# Patient Record
Sex: Male | Born: 1965 | Race: White | Hispanic: No | Marital: Married | Smoking: Never smoker
Health system: Southern US, Community
[De-identification: ages and names within clinical notes are randomized; demographics above are authoritative.]

## PROBLEM LIST (undated history)

## (undated) DIAGNOSIS — I10 Essential (primary) hypertension: Secondary | ICD-10-CM

## (undated) DIAGNOSIS — N289 Disorder of kidney and ureter, unspecified: Secondary | ICD-10-CM

---

## 2020-11-22 ENCOUNTER — Other Ambulatory Visit: Payer: Self-pay

## 2020-11-22 ENCOUNTER — Encounter (HOSPITAL_COMMUNITY): Payer: Self-pay

## 2020-11-22 ENCOUNTER — Encounter (HOSPITAL_COMMUNITY)
Admission: EM | Disposition: A | Discharge status: Home / Self Care | Payer: Self-pay | Source: Home / Self Care | Attending: Internal Medicine

## 2020-11-22 ENCOUNTER — Inpatient Hospital Stay (HOSPITAL_COMMUNITY): Payer: 59 | Admitting: Certified Registered Nurse Anesthetist

## 2020-11-22 ENCOUNTER — Inpatient Hospital Stay (HOSPITAL_COMMUNITY)
Admission: EM | Admit: 2020-11-22 | Discharge: 2020-11-24 | DRG: 378 | Disposition: A | Discharge status: Home / Self Care | Payer: 59 | Attending: Internal Medicine | Admitting: Internal Medicine

## 2020-11-22 ENCOUNTER — Emergency Department (HOSPITAL_COMMUNITY): Payer: 59

## 2020-11-22 DIAGNOSIS — D509 Iron deficiency anemia, unspecified: Secondary | ICD-10-CM

## 2020-11-22 DIAGNOSIS — N1831 Chronic kidney disease, stage 3a: Secondary | ICD-10-CM | POA: Diagnosis present

## 2020-11-22 DIAGNOSIS — E869 Volume depletion, unspecified: Secondary | ICD-10-CM | POA: Diagnosis present

## 2020-11-22 DIAGNOSIS — R1110 Vomiting, unspecified: Secondary | ICD-10-CM | POA: Diagnosis present

## 2020-11-22 DIAGNOSIS — K284 Chronic or unspecified gastrojejunal ulcer with hemorrhage: Secondary | ICD-10-CM | POA: Diagnosis not present

## 2020-11-22 DIAGNOSIS — D649 Anemia, unspecified: Secondary | ICD-10-CM

## 2020-11-22 DIAGNOSIS — Z7982 Long term (current) use of aspirin: Secondary | ICD-10-CM

## 2020-11-22 DIAGNOSIS — N179 Acute kidney failure, unspecified: Secondary | ICD-10-CM

## 2020-11-22 DIAGNOSIS — D500 Iron deficiency anemia secondary to blood loss (chronic): Secondary | ICD-10-CM | POA: Diagnosis not present

## 2020-11-22 DIAGNOSIS — D62 Acute posthemorrhagic anemia: Secondary | ICD-10-CM | POA: Diagnosis present

## 2020-11-22 DIAGNOSIS — D75839 Thrombocytosis, unspecified: Secondary | ICD-10-CM

## 2020-11-22 DIAGNOSIS — Z8616 Personal history of COVID-19: Secondary | ICD-10-CM

## 2020-11-22 DIAGNOSIS — N189 Chronic kidney disease, unspecified: Secondary | ICD-10-CM | POA: Diagnosis present

## 2020-11-22 DIAGNOSIS — D473 Essential (hemorrhagic) thrombocythemia: Secondary | ICD-10-CM | POA: Diagnosis present

## 2020-11-22 DIAGNOSIS — I1310 Hypertensive heart and chronic kidney disease without heart failure, with stage 1 through stage 4 chronic kidney disease, or unspecified chronic kidney disease: Secondary | ICD-10-CM | POA: Diagnosis present

## 2020-11-22 DIAGNOSIS — I700 Atherosclerosis of aorta: Secondary | ICD-10-CM | POA: Diagnosis present

## 2020-11-22 DIAGNOSIS — Z79899 Other long term (current) drug therapy: Secondary | ICD-10-CM | POA: Diagnosis not present

## 2020-11-22 DIAGNOSIS — K264 Chronic or unspecified duodenal ulcer with hemorrhage: Principal | ICD-10-CM | POA: Diagnosis present

## 2020-11-22 DIAGNOSIS — K922 Gastrointestinal hemorrhage, unspecified: Secondary | ICD-10-CM | POA: Diagnosis present

## 2020-11-22 DIAGNOSIS — D75838 Other thrombocytosis: Secondary | ICD-10-CM | POA: Diagnosis present

## 2020-11-22 DIAGNOSIS — K219 Gastro-esophageal reflux disease without esophagitis: Secondary | ICD-10-CM | POA: Diagnosis present

## 2020-11-22 HISTORY — DX: Disorder of kidney and ureter, unspecified: N28.9

## 2020-11-22 HISTORY — PX: BIOPSY: SHX5522

## 2020-11-22 HISTORY — DX: Essential (primary) hypertension: I10

## 2020-11-22 HISTORY — PX: ESOPHAGOGASTRODUODENOSCOPY (EGD) WITH PROPOFOL: SHX5813

## 2020-11-22 LAB — COMPREHENSIVE METABOLIC PANEL
ALT: 11 U/L (ref 0–44)
AST: 16 U/L (ref 15–41)
Albumin: 3.5 g/dL (ref 3.5–5.0)
Alkaline Phosphatase: 31 U/L — ABNORMAL LOW (ref 38–126)
Anion gap: 9 (ref 5–15)
BUN: 60 mg/dL — ABNORMAL HIGH (ref 6–20)
CO2: 21 mmol/L — ABNORMAL LOW (ref 22–32)
Calcium: 8.7 mg/dL — ABNORMAL LOW (ref 8.9–10.3)
Chloride: 107 mmol/L (ref 98–111)
Creatinine, Ser: 1.71 mg/dL — ABNORMAL HIGH (ref 0.61–1.24)
GFR, Estimated: 47 mL/min — ABNORMAL LOW (ref >60)
Glucose, Bld: 171 mg/dL — ABNORMAL HIGH (ref 70–99)
Potassium: 3.9 mmol/L (ref 3.5–5.1)
Sodium: 137 mmol/L (ref 135–145)
Total Bilirubin: 0.4 mg/dL (ref 0.3–1.2)
Total Protein: 6.1 g/dL — ABNORMAL LOW (ref 6.5–8.1)

## 2020-11-22 LAB — URINALYSIS, ROUTINE W REFLEX MICROSCOPIC
Bacteria, UA: NONE SEEN
Bilirubin Urine: NEGATIVE
Glucose, UA: NEGATIVE mg/dL
Hgb urine dipstick: NEGATIVE
Ketones, ur: NEGATIVE mg/dL
Leukocytes,Ua: NEGATIVE
Nitrite: NEGATIVE
Protein, ur: 30 mg/dL — AB
Specific Gravity, Urine: 1.026 (ref 1.005–1.030)
pH: 5 (ref 5.0–8.0)

## 2020-11-22 LAB — CBC
HCT: 27.6 % — ABNORMAL LOW (ref 39.0–52.0)
Hemoglobin: 9.3 g/dL — ABNORMAL LOW (ref 13.0–17.0)
MCH: 31.7 pg (ref 26.0–34.0)
MCHC: 33.7 g/dL (ref 30.0–36.0)
MCV: 94.2 fL (ref 80.0–100.0)
Platelets: 2357 10*3/uL (ref 150–400)
RBC: 2.93 MIL/uL — ABNORMAL LOW (ref 4.22–5.81)
RDW: 15.4 % (ref 11.5–15.5)
WBC: 18.4 10*3/uL — ABNORMAL HIGH (ref 4.0–10.5)
nRBC: 0 % (ref 0.0–0.2)

## 2020-11-22 LAB — CBC WITH DIFFERENTIAL/PLATELET
Abs Immature Granulocytes: 0.18 10*3/uL — ABNORMAL HIGH (ref 0.00–0.07)
Basophils Absolute: 0.1 10*3/uL (ref 0.0–0.1)
Basophils Relative: 1 %
Eosinophils Absolute: 0 10*3/uL (ref 0.0–0.5)
Eosinophils Relative: 0 %
HCT: 25.8 % — ABNORMAL LOW (ref 39.0–52.0)
Hemoglobin: 8.7 g/dL — ABNORMAL LOW (ref 13.0–17.0)
Immature Granulocytes: 1 %
Lymphocytes Relative: 6 %
Lymphs Abs: 0.9 10*3/uL (ref 0.7–4.0)
MCH: 32.1 pg (ref 26.0–34.0)
MCHC: 33.7 g/dL (ref 30.0–36.0)
MCV: 95.2 fL (ref 80.0–100.0)
Monocytes Absolute: 1 10*3/uL (ref 0.1–1.0)
Monocytes Relative: 6 %
Neutro Abs: 14 10*3/uL — ABNORMAL HIGH (ref 1.7–7.7)
Neutrophils Relative %: 86 %
Platelets: 1788 10*3/uL (ref 150–400)
RBC: 2.71 MIL/uL — ABNORMAL LOW (ref 4.22–5.81)
RDW: 15.4 % (ref 11.5–15.5)
WBC: 16.2 10*3/uL — ABNORMAL HIGH (ref 4.0–10.5)
nRBC: 0 % (ref 0.0–0.2)

## 2020-11-22 LAB — RESP PANEL BY RT-PCR (FLU A&B, COVID) ARPGX2
Influenza A by PCR: NEGATIVE
Influenza B by PCR: NEGATIVE
SARS Coronavirus 2 by RT PCR: NEGATIVE

## 2020-11-22 LAB — TROPONIN I (HIGH SENSITIVITY)
Troponin I (High Sensitivity): 6 ng/L (ref <18)
Troponin I (High Sensitivity): 4 ng/L (ref <18)

## 2020-11-22 LAB — PROTIME-INR
INR: 1.3 — ABNORMAL HIGH (ref 0.8–1.2)
Prothrombin Time: 15.4 seconds — ABNORMAL HIGH (ref 11.4–15.2)

## 2020-11-22 LAB — PREPARE RBC (CROSSMATCH)

## 2020-11-22 LAB — POC OCCULT BLOOD, ED: Fecal Occult Bld: POSITIVE — AB

## 2020-11-22 LAB — LIPASE, BLOOD: Lipase: 39 U/L (ref 11–51)

## 2020-11-22 LAB — LACTIC ACID, PLASMA: Lactic Acid, Venous: 1.2 mmol/L (ref 0.5–1.9)

## 2020-11-22 LAB — ABO/RH: ABO/RH(D): O NEG

## 2020-11-22 LAB — HIV ANTIBODY (ROUTINE TESTING W REFLEX): HIV Screen 4th Generation wRfx: NONREACTIVE

## 2020-11-22 SURGERY — ESOPHAGOGASTRODUODENOSCOPY (EGD) WITH PROPOFOL
Anesthesia: Monitor Anesthesia Care

## 2020-11-22 MED ORDER — PANTOPRAZOLE SODIUM 40 MG PO TBEC
80.0000 mg | DELAYED_RELEASE_TABLET | Freq: Two times a day (BID) | ORAL | Status: DC
  Administered 2020-11-22 – 2020-11-24 (×4): 80 mg via ORAL
  Filled 2020-11-22 (×4): qty 2

## 2020-11-22 MED ORDER — IOHEXOL 300 MG/ML  SOLN
100.0000 mL | Freq: Once | INTRAMUSCULAR | Status: AC | PRN
  Administered 2020-11-22: 100 mL via INTRAVENOUS

## 2020-11-22 MED ORDER — SODIUM CHLORIDE 0.9 % IV BOLUS
1000.0000 mL | Freq: Once | INTRAVENOUS | Status: AC
  Administered 2020-11-22: 1000 mL via INTRAVENOUS

## 2020-11-22 MED ORDER — ACETAMINOPHEN 325 MG PO TABS: 650.0000 mg | ORAL_TABLET | Freq: Four times a day (QID) | ORAL | Status: DC | PRN

## 2020-11-22 MED ORDER — LIDOCAINE 2% (20 MG/ML) 5 ML SYRINGE
INTRAMUSCULAR | Status: DC | PRN
  Administered 2020-11-22: 100 mg via INTRAVENOUS

## 2020-11-22 MED ORDER — SODIUM CHLORIDE 0.9 % IV SOLN
80.0000 mg | Freq: Once | INTRAVENOUS | Status: AC
  Administered 2020-11-22: 80 mg via INTRAVENOUS
  Filled 2020-11-22: qty 80

## 2020-11-22 MED ORDER — ACETAMINOPHEN 650 MG RE SUPP: 650.0000 mg | Freq: Four times a day (QID) | RECTAL | Status: DC | PRN

## 2020-11-22 MED ORDER — LACTATED RINGERS IV SOLN: INTRAVENOUS | Status: DC | PRN

## 2020-11-22 MED ORDER — PROPOFOL 500 MG/50ML IV EMUL
INTRAVENOUS | Status: DC | PRN
  Administered 2020-11-22: 125 ug/kg/min via INTRAVENOUS

## 2020-11-22 MED ORDER — SODIUM CHLORIDE 0.9 % IV SOLN: 10.0000 mL/h | Freq: Once | INTRAVENOUS | Status: DC

## 2020-11-22 MED ORDER — PROPOFOL 10 MG/ML IV BOLUS
INTRAVENOUS | Status: DC | PRN
  Administered 2020-11-22 (×3): 30 mg via INTRAVENOUS
  Administered 2020-11-22 (×2): 20 mg via INTRAVENOUS

## 2020-11-22 SURGICAL SUPPLY — 14 items

## 2020-11-22 NOTE — ED Triage Notes (Signed)
Patient complains of abdominal pain with diarrhea x 3 days. Patient pale on arrival and states that he feels near syncopal with any activity. Alert and oriented

## 2020-11-22 NOTE — ED Provider Notes (Signed)
[facility] EMERGENCY DEPARTMENT Provider Note   CSN: [id number] Arrival date & time: 11/22/20  5188     History No chief complaint on file.   Jermaine Russell is a 55 y.o. male.  55 year old male with history of hypertension and renal disorder, onset Saturday (3 days ago)- diarrhea, nausea, no vomiting, upper abdominal pain that radiates to chest, SHOB with walking and improves with rest. No known sick contacts. Diarrhea is described as black, taking peptobismal with some relief. No prior abdominal surgeries. Denies fevers, does report chills. Decreased PO intake due to diarrhea and pain.  History cardiomegaly, scheduled to see cardiology this month, recently diagnosed with HTN. Not taking meds due to being sick. Takes 81mg  ASA every other day for elevated platelets. History of COVID x 3,         Past Medical History:  Diagnosis Date  . Hypertension   . Renal disorder     There are no problems to display for this patient.   History reviewed. No pertinent surgical history.     No family history on file.     Home Medications Prior to Admission medications   Not on File    Allergies    Patient has no known allergies.  Review of Systems   Review of Systems  Constitutional: Positive for chills. Negative for fever.  Respiratory: Positive for shortness of breath.   Cardiovascular: Negative for chest pain.  Gastrointestinal: Positive for abdominal pain, diarrhea and nausea. Negative for constipation and vomiting.  Genitourinary: Negative for decreased urine volume, difficulty urinating and dysuria.  Musculoskeletal: Negative for arthralgias and myalgias.  Skin: Negative for rash and wound.  Allergic/Immunologic: Negative for immunocompromised state.  Neurological: Positive for light-headedness.  Psychiatric/Behavioral: Negative for confusion.  All other systems reviewed and are negative.   Physical Exam Updated Vital Signs BP (!) 148/84    Pulse (!) 103   Temp 98.3 F (36.8 C) (Oral)   Resp 16   SpO2 100%   Physical Exam Vitals and nursing note reviewed.  Constitutional:      General: He is not in acute distress.    Appearance: He is well-developed. He is not diaphoretic.  HENT:     Head: Normocephalic and atraumatic.     Ears:     Comments: Mucous membranes pale    Mouth/Throat:     Mouth: Mucous membranes are moist.  Eyes:     Comments: Conjunctivae pale   Cardiovascular:     Rate and Rhythm: Regular rhythm. Tachycardia present.     Pulses: Normal pulses.     Heart sounds: Normal heart sounds.  Pulmonary:     Effort: Pulmonary effort is normal.     Breath sounds: Normal breath sounds.  Chest:     Chest wall: No tenderness.  Abdominal:     Palpations: Abdomen is soft.     Tenderness: There is no abdominal tenderness.  Genitourinary:    Rectum: Guaiac result positive.     Comments: Rectal exam deferred, stool sample provided- black, formed, hemoccult positive Musculoskeletal:        General: No swelling or tenderness.     Right lower leg: No edema.     Left lower leg: No edema.  Skin:    General: Skin is warm and dry.     Coloration: Skin is pale.  Neurological:     Mental Status: He is alert and oriented to person, place, and time.     Motor:  No weakness.     Gait: Gait normal.  Psychiatric:        Behavior: Behavior normal.     ED Results / Procedures / Treatments   Labs (all labs ordered are listed, but only abnormal results are displayed) Labs Reviewed  COMPREHENSIVE METABOLIC PANEL - Abnormal; Notable for the following components:      Result Value   CO2 21 (*)    Glucose, Bld 171 (*)    BUN 60 (*)    Creatinine, Ser 1.71 (*)    Calcium 8.7 (*)    Total Protein 6.1 (*)    Alkaline Phosphatase 31 (*)    GFR, Estimated 47 (*)    All other components within normal limits  CBC - Abnormal; Notable for the following components:   WBC 18.4 (*)    RBC 2.93 (*)    Hemoglobin 9.3 (*)     HCT 27.6 (*)    Platelets 2,357 (*)    All other components within normal limits  URINALYSIS, ROUTINE W REFLEX MICROSCOPIC - Abnormal; Notable for the following components:   APPearance HAZY (*)    Protein, ur 30 (*)    All other components within normal limits  POC OCCULT BLOOD, ED - Abnormal; Notable for the following components:   Fecal Occult Bld POSITIVE (*)    All other components within normal limits  RESP PANEL BY RT-PCR (FLU A&B, COVID) ARPGX2  LIPASE, BLOOD  PROTIME-INR  PATHOLOGIST SMEAR REVIEW  TYPE AND SCREEN  PREPARE RBC (CROSSMATCH)  ABO/RH  TROPONIN I (HIGH SENSITIVITY)    EKG EKG Interpretation  Date/Time:  Tuesday November 22 2020 09:31:31 EDT Ventricular Rate:  113 PR Interval:  136 QRS Duration: 88 QT Interval:  304 QTC Calculation: 417 R Axis:   14 Text Interpretation: Sinus tachycardia diffuse T wave changes HR a little less, otherwise similar to earlier in the day Confirmed by Sherwood Gambler [phone]) on 11/22/2020 9:53:44 AM   Radiology DG Chest Port 1 View  Result Date: 11/22/2020 CLINICAL DATA:  Shortness of breath.  Abdominal pain EXAM: PORTABLE CHEST 1 VIEW COMPARISON:  None. FINDINGS: Normal heart size and mediastinal contours. No acute infiltrate or edema. No effusion or pneumothorax. No acute osseous findings. IMPRESSION: Negative chest. Electronically Signed   By: Monte Fantasia M.D.   On: 11/22/2020 09:58    Procedures .Critical Care Performed by: Tacy Learn, PA-C Authorized by: Tacy Learn, PA-C   Critical care provider statement:    Critical care time (minutes):  45   Critical care was time spent personally by me on the following activities:  Discussions with consultants, evaluation of patient's response to treatment, examination of patient, ordering and performing treatments and interventions, ordering and review of laboratory studies, ordering and review of radiographic studies, pulse oximetry, re-evaluation of patient's  condition, obtaining history from patient or surrogate and review of old charts     Medications Ordered in ED Medications  0.9 %  sodium chloride infusion (0 mL/hr Intravenous Hold 11/22/20 1055)  pantoprazole (PROTONIX) 80 mg in sodium chloride 0.9 % 100 mL IVPB (has no administration in time range)  sodium chloride 0.9 % bolus 1,000 mL (1,000 mLs Intravenous New Bag/Given 11/22/20 1055)  iohexol (OMNIPAQUE) 300 MG/ML solution 100 mL (100 mLs Intravenous Contrast Given 11/22/20 1140)    ED Course  I have reviewed the triage vital signs and the nursing notes.  Pertinent labs & imaging results that were available during my care of the patient  were reviewed by me and considered in my medical decision making (see chart for details).  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  Takes 81 mg aspirin every other day for elevated platelet count.  States has had COVID 3 times in the past, did not have any medical problems prior to this however since has had elevated blood pressure, been told he had an enlarged heart and elevated platelets.  Patient is awaiting follow-up with cardiology next month, has had difficulty following up with hematology and general care due to his work schedule.  On exam, patient appears pale, has pale conjunctiva and mucous membranes.  Abdomen is soft and nontender.  Rectal exam is deferred, stool samples provided and is formed and black, Hemoccult positive.  Patient is tachycardic to 140s with exertion, improves to 100 with rest.  CBC today compared with prior labs in care everywhere from January 2022.  White blood cell count 18.4, previously 15.  Hemoglobin now 9.3 with hematocrit of 27.6.  Previous hemoglobin of 15.1 hematocrit of 45.3.   Platelets elevated at 2,357, previously 2226.  CMP reviewed, creatinine 1.71, previously 1.59.  BUN 60, 21 previously. [LM]  2542 CT abdomen pelvis obtained after review of records, no prior imaging on file.  GI paged for consult for symptomatic anemia with GI bleed. She consents to transfusion, will order 1 unit of blood.  We will also order Protonix. [LM]  1100 Discussed with Dr. Benson Norway with GI who will plan for scope today, will call hospitalist/unassigned to admit.   Add on troponin due to left side CP with exertion, consider demand ischemia secondary to blood loss.  [LM]  1101 Last ate yesterday morning, had small cup of water this morning, is aware of NPO. [LM]  1146 Case discussed with hosptialist who will consult for admission.  [LM]    Clinical Course User Index [LM] Roque Lias   MDM Rules/Calculators/A&P                          Final Clinical Impression(s) / ED Diagnoses Final diagnoses:  Gastrointestinal hemorrhage, unspecified gastrointestinal hemorrhage type  Symptomatic anemia  Thrombocytosis, unspecified    Rx / DC Orders ED Discharge Orders    None       Tacy Learn, PA-C 11/22/20 1148    Sherwood Gambler, MD 11/23/20 [phone]

## 2020-11-22 NOTE — Anesthesia Postprocedure Evaluation (Signed)
Anesthesia Post Note  Patient: Jermaine Russell  Procedure(s) Performed: ESOPHAGOGASTRODUODENOSCOPY (EGD) WITH PROPOFOL (N/A ) BIOPSY     Patient location during evaluation: Endoscopy Anesthesia Type: MAC Level of consciousness: awake and alert Pain management: pain level controlled Vital Signs Assessment: post-procedure vital signs reviewed and stable Respiratory status: spontaneous breathing, nonlabored ventilation, respiratory function stable and patient connected to nasal cannula oxygen Cardiovascular status: stable and blood pressure returned to baseline Postop Assessment: no apparent nausea or vomiting Anesthetic complications: no   No complications documented.  Last Vitals:  Vitals:   11/22/20 1530 11/22/20 1555  BP: (!) 128/52 (!) 127/56  Pulse: 83 75  Resp: 17 16  Temp:  36.9 C  SpO2: 100% 100%    Last Pain:  Vitals:   11/22/20 1555  TempSrc: Oral  PainSc:                  [name]

## 2020-11-22 NOTE — ED Notes (Signed)
Consent for surgery and blood is at bedside.

## 2020-11-22 NOTE — Consult Note (Addendum)
UNASSIGNED CONSULT  Reason for Consult: GI bleed Referring Physician: [facility]  Jermaine Russell HPI: This is a 55 year old male with a PMH of HTN admitted for melena, periumbilical pain, and anemia.  Acutely he started to experience these symptoms starting this past Saturday.  Prior to this time he denied any issues with GI bleeding or abdominal pain.  He has a history of GERD, but he does not take any medications.  Nocturnal waking is an issue for him.  With the the persistent bleeding he started to have more symptoms of fatigue.  This is what prompted him to present to the ER.  His HGB today is at 8.7 g/dL, which is a drop from 15 g/dL.  This higher HGB value was obtained through Care Everywhere.  He does not take any NSAID.  Past Medical History:  Diagnosis Date  . Hypertension   . Renal disorder     History reviewed. No pertinent surgical history.  History reviewed. No pertinent family history.  Social History:  has no history on file for tobacco use, alcohol use, and drug use.  Allergies: No Known Allergies  Medications:  Scheduled:  Continuous: . [MAR Hold] sodium chloride Stopped (11/22/20 1055)    Results for orders placed or performed during the hospital encounter of 11/22/20 (from the past 24 hour(s))  Lipase, blood     Status: None   Collection Time: 11/22/20  8:34 AM  Result Value Ref Range   Lipase 39 11 - 51 U/L  Comprehensive metabolic panel     Status: Abnormal   Collection Time: 11/22/20  8:34 AM  Result Value Ref Range   Sodium 137 135 - 145 mmol/L   Potassium 3.9 3.5 - 5.1 mmol/L   Chloride 107 98 - 111 mmol/L   CO2 21 (L) 22 - 32 mmol/L   Glucose, Bld 171 (H) 70 - 99 mg/dL   BUN 60 (H) 6 - 20 mg/dL   Creatinine, Ser 1.71 (H) 0.61 - 1.24 mg/dL   Calcium 8.7 (L) 8.9 - 10.3 mg/dL   Total Protein 6.1 (L) 6.5 - 8.1 g/dL   Albumin 3.5 3.5 - 5.0 g/dL   AST 16 15 - 41 U/L   ALT 11 0 - 44 U/L   Alkaline Phosphatase 31 (L) 38 - 126 U/L   Total  Bilirubin 0.4 0.3 - 1.2 mg/dL   GFR, Estimated 47 (L) >60 mL/min   Anion gap 9 5 - 15  CBC     Status: Abnormal   Collection Time: 11/22/20  8:34 AM  Result Value Ref Range   WBC 18.4 (H) 4.0 - 10.5 K/uL   RBC 2.93 (L) 4.22 - 5.81 MIL/uL   Hemoglobin 9.3 (L) 13.0 - 17.0 g/dL   HCT 27.6 (L) 39.0 - 52.0 %   MCV 94.2 80.0 - 100.0 fL   MCH 31.7 26.0 - 34.0 pg   MCHC 33.7 30.0 - 36.0 g/dL   RDW 15.4 11.5 - 15.5 %   Platelets 2,357 (HH) 150 - 400 K/uL   nRBC 0.0 0.0 - 0.2 %  ABO/Rh     Status: None   Collection Time: 11/22/20  8:34 AM  Result Value Ref Range   ABO/RH(D)      O NEG Performed at [facility], [address] [place], [place] [address]   Urinalysis, Routine w reflex microscopic Urine, Clean Catch     Status: Abnormal   Collection Time: 11/22/20  9:34 AM  Result  Value Ref Range   Color, Urine YELLOW YELLOW   APPearance HAZY (A) CLEAR   Specific Gravity, Urine 1.026 1.005 - 1.030   pH 5.0 5.0 - 8.0   Glucose, UA NEGATIVE NEGATIVE mg/dL   Hgb urine dipstick NEGATIVE NEGATIVE   Bilirubin Urine NEGATIVE NEGATIVE   Ketones, ur NEGATIVE NEGATIVE mg/dL   Protein, ur 30 (A) NEGATIVE mg/dL   Nitrite NEGATIVE NEGATIVE   Leukocytes,Ua NEGATIVE NEGATIVE   RBC / HPF 0-5 0 - 5 RBC/hpf   WBC, UA 0-5 0 - 5 WBC/hpf   Bacteria, UA NONE SEEN NONE SEEN   Squamous Epithelial / LPF 0-5 0 - 5   Mucus PRESENT    Hyaline Casts, UA PRESENT   Resp Panel by RT-PCR (Flu A&B, Covid) Nasopharyngeal Swab     Status: None   Collection Time: 11/22/20 10:21 AM   Specimen: Nasopharyngeal Swab; Nasopharyngeal(NP) swabs in vial transport medium  Result Value Ref Range   SARS Coronavirus 2 by RT PCR NEGATIVE NEGATIVE   Influenza A by PCR NEGATIVE NEGATIVE   Influenza B by PCR NEGATIVE NEGATIVE  POC occult blood, ED     Status: Abnormal   Collection Time: 11/22/20 10:36 AM  Result Value Ref Range   Fecal Occult Bld POSITIVE (A) NEGATIVE  Protime-INR     Status: Abnormal   Collection Time:  11/22/20 10:57 AM  Result Value Ref Range   Prothrombin Time 15.4 (H) 11.4 - 15.2 seconds   INR 1.3 (H) 0.8 - 1.2  Type and screen Hermin[place]     Status: None (Preliminary result)   Collection Time: 11/22/20 10:57 AM  Result Value Ref Range   ABO/RH(D) O NEG    Antibody Screen NEG    Sample Expiration 11/25/2020,2359    Unit Number [id number]    Blood Component Type RED CELLS,LR    Unit division 00    Status of Unit ISSUED    Transfusion Status OK TO TRANSFUSE    Crossmatch Result      Compatible Performed at [facility], [address] [place], [place] [address]   Prepare RBC (crossmatch)     Status: None   Collection Time: 11/22/20 10:57 AM  Result Value Ref Range   Order Confirmation      ORDER PROCESSED BY BLOOD BANK Performed at [facility], [place] [address], [place], [place] [address]   Troponin I (High Sensitivity)     Status: None   Collection Time: 11/22/20 11:05 AM  Result Value Ref Range   Troponin I (High Sensitivity) 6 <18 ng/L  CBC with Differential/Platelet     Status: Abnormal   Collection Time: 11/22/20 11:19 AM  Result Value Ref Range   WBC 16.2 (H) 4.0 - 10.5 K/uL   RBC 2.71 (L) 4.22 - 5.81 MIL/uL   Hemoglobin 8.7 (L) 13.0 - 17.0 g/dL   HCT 25.8 (L) 39.0 - 52.0 %   MCV 95.2 80.0 - 100.0 fL   MCH 32.1 26.0 - 34.0 pg   MCHC 33.7 30.0 - 36.0 g/dL   RDW 15.4 11.5 - 15.5 %   Platelets 1,788 (HH) 150 - 400 K/uL   nRBC 0.0 0.0 - 0.2 %   Neutrophils Relative % 86 %   Neutro Abs 14.0 (H) 1.7 - 7.7 K/uL   Lymphocytes Relative 6 %   Lymphs Abs 0.9 0.7 - 4.0 K/uL   Monocytes Relative 6 %   Monocytes Absolute 1.0 0.1 - 1.0  K/uL   Eosinophils Relative 0 %   Eosinophils Absolute 0.0 0.0 - 0.5 K/uL   Basophils Relative 1 %   Basophils Absolute 0.1 0.0 - 0.1 K/uL   Immature Granulocytes 1 %   Abs Immature Granulocytes 0.18 (H) 0.00 - 0.07 K/uL  Troponin I (High Sensitivity)     Status: None   Collection Time: 11/22/20  1:05  PM  Result Value Ref Range   Troponin I (High Sensitivity) 4 <18 ng/L  Lactic acid, plasma     Status: None   Collection Time: 11/22/20  1:08 PM  Result Value Ref Range   Lactic Acid, Venous 1.2 0.5 - 1.9 mmol/L     CT Abdomen Pelvis W Contrast  Result Date: 11/22/2020 CLINICAL DATA:  Abdominal pain with diarrhea for 3 days, near syncopal feeling with activities EXAM: CT ABDOMEN AND PELVIS WITH CONTRAST TECHNIQUE: Multidetector CT imaging of the abdomen and pelvis was performed using the standard protocol following bolus administration of intravenous contrast. Sagittal and coronal MPR images reconstructed from axial data set. CONTRAST:  128mL OMNIPAQUE IOHEXOL 300 MG/ML SOLN IV. No oral contrast. COMPARISON:  None. FINDINGS: Lower chest: Lung bases clear. Hepatobiliary: Gallbladder and liver normal appearance Pancreas: Normal appearance Spleen: Normal appearance Adrenals/Urinary Tract: Adrenal glands, kidneys, ureters, and bladder normal appearance Stomach/Bowel: Appendix not visualized. Stomach under distended with suboptimal assessment of wall thickness. Bowel loops normal appearance Vascular/Lymphatic: Few pelvic phleboliths. Minimal atherosclerotic calcification aorta and iliac arteries. Aorta normal caliber. No adenopathy. Reproductive: Prostate gland and seminal vesicles unremarkable. Other: No free air or free fluid. Tiny RIGHT inguinal hernia containing fat. No acute inflammatory process. Musculoskeletal: Retrolisthesis L5-S1 with endplate spur formation and bulging disc. Degenerative changes LEFT hip joint. IMPRESSION: No acute intra-abdominal or intrapelvic abnormalities. Tiny RIGHT inguinal hernia containing fat. Aortic Atherosclerosis (ICD10-I70.0). Electronically Signed   By: Lavonia Dana M.D.   On: 11/22/2020 12:35   DG Chest Port 1 View  Result Date: 11/22/2020 CLINICAL DATA:  Shortness of breath.  Abdominal pain EXAM: PORTABLE CHEST 1 VIEW COMPARISON:  None. FINDINGS: Normal heart size  and mediastinal contours. No acute infiltrate or edema. No effusion or pneumothorax. No acute osseous findings. IMPRESSION: Negative chest. Electronically Signed   By: Monte Fantasia M.D.   On: 11/22/2020 09:58    ROS:  As stated above in the HPI otherwise negative.  Blood pressure (!) 143/76, pulse 99, temperature 98 F (36.7 C), temperature source Oral, resp. rate 12, height 6\' 2"  (1.88 m), weight 106.6 kg, SpO2 99 %.    PE: Gen: NAD, Alert and Oriented HEENT:  /AT, EOMI Neck: Supple, no LAD Lungs: CTA Bilaterally CV: RRR without M/G/R ABD: Soft, mild periumbilical pain, +BS Ext: No C/C/E  Assessment/Plan: 1) Upper GI bleed. 2) Anemia. 3) Melena.   The pretest probability is that the patient has PUD.  He requires an EGD for further evaluation.  Plan: 1) EGD now.  Mekaela Azizi D 11/22/2020, 2:53 PM

## 2020-11-22 NOTE — Op Note (Signed)
[facility] Patient Name: Jermaine Russell Procedure Date : 11/22/2020 MRN: [id number] Attending MD: Carol Ada , MD Date of Birth: 1965/11/15 CSN: [id number] Age: 55 Admit Type: Inpatient Procedure:                Upper GI endoscopy Indications:              Periumbilical abdominal pain, Heme positive stool,                            Melena Providers:                Carol Ada, MD, Clyde Lundborg, RN, Tyrone Apple, Technician, Dorthea Cove, CRNA Referring MD:              Medicines:                Propofol per Anesthesia Complications:            No immediate complications. Estimated Blood Loss:     Estimated blood loss: none. Procedure:                Pre-Anesthesia Assessment:                           - Prior to the procedure, a History and Physical                            was performed, and patient medications and                            allergies were reviewed. The patient's tolerance of                            previous anesthesia was also reviewed. The risks                            and benefits of the procedure and the sedation                            options and risks were discussed with the patient.                            All questions were answered, and informed consent                            was obtained. Prior Anticoagulants: The patient has                            taken no previous anticoagulant or antiplatelet                            agents. ASA Grade Assessment: II - A patient with  mild systemic disease. After reviewing the risks                            and benefits, the patient was deemed in                            satisfactory condition to undergo the procedure.                           - Sedation was administered by an anesthesia                            professional. Deep sedation was attained.                           After obtaining informed  consent, the endoscope was                            passed under direct vision. Throughout the                            procedure, the patient's blood pressure, pulse, and                            oxygen saturations were monitored continuously. The                            [id number]-[id number] ([id number] Olympus gastroscope was                            introduced through the mouth, and advanced to the                            second part of duodenum. The upper GI endoscopy was                            accomplished without difficulty. The patient                            tolerated the procedure well. Scope In: Scope Out: Findings:      The esophagus was normal.      One non-bleeding cratered gastric ulcer with a clean ulcer base (Forrest       Class III) was found in the gastric antrum. The lesion was 10 mm in       largest dimension. Biopsies were taken with a cold forceps for histology.      One non-bleeding superficial duodenal ulcer with a clean ulcer base       (Forrest Class III) was found in the duodenal bulb. The lesion was 4 mm       in largest dimension. Impression:               - Normal esophagus.                           - Non-bleeding gastric ulcer with a  clean ulcer                            base (Forrest Class III). Biopsied.                           - Non-bleeding duodenal ulcer with a clean ulcer                            base (Forrest Class III). Recommendation:           - Return patient to hospital ward for ongoing care.                           - Resume regular diet.                           - Continue present medications.                           - Await pathology results.                           - PPI BID x 1 month and then QD.                           - If positive for H. pylori, quadruple therapy will                            be rendered. (The patient lives in [place] and he                            will need local follow up). Procedure  Code(s):        --- Professional ---                           [phone], Esophagogastroduodenoscopy, flexible,                            transoral; with biopsy, single or multiple Diagnosis Code(s):        --- Professional ---                           K25.9, Gastric ulcer, unspecified as acute or                            chronic, without hemorrhage or perforation                           K26.9, Duodenal ulcer, unspecified as acute or                            chronic, without hemorrhage or perforation                           X38.18, Periumbilical pain  R19.5, Other fecal abnormalities                           K92.1, Melena (includes Hematochezia) CPT copyright 2019 [employer]. All rights reserved. The codes documented in this report are preliminary and upon coder review may  be revised to meet current compliance requirements. Carol Ada, MD Carol Ada, MD 11/22/2020 3:13:42 PM This report has been signed electronically. Number of Addenda: 0

## 2020-11-22 NOTE — Transfer of Care (Signed)
Immediate Anesthesia Transfer of Care Note  Patient: Jermaine Russell  Procedure(s) Performed: ESOPHAGOGASTRODUODENOSCOPY (EGD) WITH PROPOFOL (N/A ) BIOPSY  Patient Location: Endoscopy Unit  Anesthesia Type:MAC  Level of Consciousness: awake, alert  and oriented  Airway & Oxygen Therapy: Patient Spontanous Breathing and Patient connected to face mask oxygen  Post-op Assessment: Report given to RN and Post -op Vital signs reviewed and stable  Post vital signs: Reviewed and stable  Last Vitals:  Vitals Value Taken Time  BP    Temp    Pulse    Resp    SpO2      Last Pain:  Vitals:   11/22/20 1400  TempSrc: Oral  PainSc: 0-No pain         Complications: No complications documented.

## 2020-11-22 NOTE — H&P (Signed)
Date: 11/22/2020               Patient Name:  Jermaine Russell MRN: [id number]  DOB: [birth year] Age / Sex: 55 y.o., male   PCP: Patient, No Pcp Per (Inactive)         Medical Service: Internal Medicine Teaching Service         Attending Physician: Dr. Aldine Contes, MD    First Contact: Dr. Konrad Penta Pager: [phone]  Second Contact: Dr. Marianna Payment Pager: [phone]       After Hours (After 5p/  First Contact Pager: [phone]  weekends / holidays): Second Contact Pager: [phone]   Chief Complaint: Nausea, abdominal pain  History of Present Illness: This is a 55 year old male with a history of HTN, COVID 3 times, and thrombocytopenia presenting with a 3 day history of left upper abdominal pain, black stools, nausea, and diarrhea for 3 days.   On Saturday he started having diffuse abdominal pain, was an aching sensation, worse with walking around or driving history for, improved with Pepto-Bismol.  Did not change with bowel movements or with eating.  He also was having some dizziness and lightheadedness, and started having dark stools that were loose in nature.  He also endorsed some dry heaves and some shortness of breath with exertion.  Denied any nausea, vomiting, chest pain, cough, palpitations, headaches, rash, hematochezia or other sources of bleeding. He currently takes aspirin every few days, scheduled to take it daily, he is also taking hydroxyurea daily for his elevated platelets. He has been taking robaxin recently for his shoulder. Denied any over the counter NSAID use. He denies smoking, recent alcohol, or recreational drug use, no IVDU.   Patient has a history of thrombocytopenia that he states started after his Covid diagnosis in 2020.  States that he had a bone marrow biopsy done was JAK2 positive, did not show any other abnormalities, we do not have these records.  He had seen hematology/oncology in the past, was initially on aspirin 81 mg daily and they had considered  hydroxyurea.  He then states he went to an urgent care and he was given a prescription for hydroxyurea at that time.  He has not seen hematology since then.  Was supposed to see cardiology later this month and get an echocardiogram for an enlarged heart. He has been having dizziness and he saw ENT, being evaluated for OSA.   In the ED patient was noted to be tachycardic up to 140s, improved to around 120s, hypertensive up to 155/112, RR ranged 15-20, and afebrile. Labs significant for WBC 18.4, Hgb 9.3 (last Hgb was 15 in Jan 22), PLT 2357. CR 1.71 (unclear baseline, but last labs showd Cr 1.5). FOBT +. Noted to have dark stools on exam. Patient was started on IV protonix, given 1 L NS, and 1 unit of pRBC was ordered. GI was consulted. IMTS consulted for admission.    Meds:  ASA daily (only taking every 3-4 days) Hydroxyurea 500 mg daily Amlodipine 5 mg daily Metoprolol succinate 25 mg daily (is not taking this)   Allergies: Allergies as of 11/22/2020  . (No Known Allergies)   Past Medical History:  Diagnosis Date  . Hypertension   . Renal disorder     Family History: Mother has some type of cancer, possibly bone marrow   Social History: Denies smoking or recreational drug use. Reports occasional EtOH use, about once every 6 months. Is a truck driver, lives in [place].  Lives with wife and 41 year old child.    Review of Systems: A complete ROS was negative except as per HPI.   Physical Exam: Blood pressure (!) 146/85, pulse 98, temperature 97.8 F (36.6 C), temperature source Oral, resp. rate (!) 23, SpO2 99 %. Physical Exam Constitutional:      Appearance: Normal appearance.  HENT:     Head: Normocephalic and atraumatic.     Mouth/Throat:     Mouth: Mucous membranes are moist.     Pharynx: Oropharynx is clear.  Eyes:     Extraocular Movements: Extraocular movements intact.     Pupils: Pupils are equal, round, and reactive to light.     Comments: Conjunctival pallor   Cardiovascular:     Rate and Rhythm: Regular rhythm. Tachycardia present.     Pulses: Normal pulses.     Heart sounds: Normal heart sounds.  Pulmonary:     Effort: Pulmonary effort is normal.     Breath sounds: Normal breath sounds.  Abdominal:     General: Abdomen is flat. Bowel sounds are normal. There is no distension.     Palpations: Abdomen is soft.  Musculoskeletal:        General: No swelling. Normal range of motion.     Cervical back: Normal range of motion and neck supple.  Skin:    General: Skin is warm and dry.     Capillary Refill: Capillary refill takes less than 2 seconds.     Comments: No rash or petechia noted  Neurological:     General: No focal deficit present.     Mental Status: He is alert and oriented to person, place, and time.  Psychiatric:        Mood and Affect: Mood normal.        Behavior: Behavior normal.      EKG: personally reviewed my interpretation is sinus tachycardia, HR 113, normal, TWI in 1, aVL, and V6, no priors to compare  CXR: personally reviewed my interpretation is good inspiration, no cardiomegaly, clear costodiaphragmatic angles, no infiltrates noted  CT abdomen/pelvis: IMPRESSION: No acute intra-abdominal or intrapelvic abnormalities.  Tiny RIGHT inguinal hernia containing fat.  Aortic Atherosclerosis (ICD10-I70.0).  Assessment & Plan by Problem: Active Problems:   GIB (gastrointestinal bleeding)  Symptomatic anemia 8/65 GIB: 55 year old male with a history of hypertension, thrombocytopenia, and recent Covid infection who is presenting with a 3-day history of diffuse abdominal pain, melena, lightheadedness, and dizziness. Reports that this was the first time something like this has happened. Takes ASA every 4-5 days, no other NSAID use.  Denied any alcohol, smoking, or recreational drug use. On exam patient is tachycardic, hypertensive, and appears anemic on exam. Hgb 9.3, down from 15 in Jan 22. FOBT +. Given 1L NS,  protonix, and 1 unit pRBC in the ED. Repeat CBC around 11 AM showed a Hgb of 8.7. GI consulted, plan to scope at some point. Differential includes gastric/duodenal ulcer, gastritis, esophagitis. CT abd/pelvis showed tiny right inguinal hernia, no acute intra-abdominal or intrapelvic abnormalities.   -GI consulted, appreciate recommendations -Continue Protonix 80 mg IV BID -F/u post transfusion H+H -Daily CBC -NPO pending GI eval -SCDs for DVT prophylaxis -Admit to progressive  Thrombocytopenia:  Plt 2357, patient reports this has been elevated since covid infection. Has seen hematology in the past, and reportedly had a bone marrow biopsy that patient reports was positive for JAK-2 positive. He was initially started on ASA daily and then saw an urgent care and was  prescribed hydroxyurea 500 mg daily.  -F/u pathology smear -Daily CBC -Holding home hydroxyurea 500 mg daily for NPO status -Will need to try obtain records from hematologist office  HTN: Patient on amlodipine 5 mg daily at home. He was prescribed metoprolol 25 mg daily however patient reports not taking this medication. BP while here elevated up to 155/112. Currently with symptomatic anemia and GIB. Will hold home BP medications for now.  -Hold home amlodipine 5 mg daily -Monitor Bps  Acute on chronic CKD: Cr 1.7 on admission, BL unclear however last few checks were 1.5 and 1.3. Appears volume depleted on exam. May be pre-renal in nature.  -Continue maintenance fluids -Daily BMP -Avoid nephrotoxic agents  Dispo: Admit patient to Inpatient with expected length of stay greater than 2 midnights.  Signed: Asencion Noble, MD 11/22/2020, 1:25 PM  Pager: [phone] After 5pm on weekdays and 1pm on weekends: On Call pager: [phone]

## 2020-11-22 NOTE — Anesthesia Procedure Notes (Signed)
Procedure Name: MAC Date/Time: 11/22/2020 2:56 PM Performed by: Dorthea Cove, CRNA Pre-anesthesia Checklist: Patient identified, Emergency Drugs available, Suction available, Patient being monitored and Timeout performed Patient Re-evaluated:Patient Re-evaluated prior to induction Oxygen Delivery Method: Nasal cannula Preoxygenation: Pre-oxygenation with 100% oxygen Induction Type: IV induction Placement Confirmation: positive ETCO2 Dental Injury: Teeth and Oropharynx as per pre-operative assessment

## 2020-11-22 NOTE — Anesthesia Preprocedure Evaluation (Signed)
Anesthesia Evaluation  Patient identified by MRN, date of birth, ID band Patient awake    Reviewed: Allergy & Precautions, NPO status , Patient's Chart, lab work & pertinent test results  Airway Mallampati: II  TM Distance: >3 FB Neck ROM: Full    Dental  (+) Teeth Intact   Pulmonary neg pulmonary ROS,    Pulmonary exam normal        Cardiovascular hypertension, Pt. on medications and Pt. on home beta blockers  Rhythm:Regular Rate:Normal     Neuro/Psych negative neurological ROS  negative psych ROS   GI/Hepatic Patient received Oral Contrast Agents,GIB   Endo/Other    Renal/GU CRFRenal disease  negative genitourinary   Musculoskeletal negative musculoskeletal ROS (+)   Abdominal (+)  Abdomen: soft. Bowel sounds: normal.  Peds  Hematology  (+) Blood dyscrasia, , On hydroxyurea for thrombocythemia    Anesthesia Other Findings   Reproductive/Obstetrics                             Anesthesia Physical Anesthesia Plan  ASA: III  Anesthesia Plan: MAC   Post-op Pain Management:    Induction: Intravenous  PONV Risk Score and Plan: 1 and Ondansetron and Propofol infusion  Airway Management Planned: Simple Face Mask, Natural Airway and Nasal Cannula  Additional Equipment: None  Intra-op Plan:   Post-operative Plan:   Informed Consent: I have reviewed the patients History and Physical, chart, labs and discussed the procedure including the risks, benefits and alternatives for the proposed anesthesia with the patient or authorized representative who has indicated his/her understanding and acceptance.     Dental advisory given  Plan Discussed with: CRNA  Anesthesia Plan Comments: (Lab Results      Component                Value               Date                      WBC                      16.2 (H)            11/22/2020                HGB                      8.7 (L)              11/22/2020                HCT                      25.8 (L)            11/22/2020                MCV                      95.2                11/22/2020                PLT                      1,788 ([place])          11/22/2020  Lab Results      Component                Value               Date                      NA                       137                 11/22/2020                K                        3.9                 11/22/2020                CO2                      21 (L)              11/22/2020                GLUCOSE                  171 (H)             11/22/2020                BUN                      60 (H)              11/22/2020                CREATININE               1.71 (H)            11/22/2020                CALCIUM                  8.7 (L)             11/22/2020                GFRNONAA                 47 (L)              11/22/2020          )        Anesthesia Quick Evaluation

## 2020-11-23 ENCOUNTER — Encounter (HOSPITAL_COMMUNITY): Payer: Self-pay | Admitting: Gastroenterology

## 2020-11-23 DIAGNOSIS — K922 Gastrointestinal hemorrhage, unspecified: Secondary | ICD-10-CM

## 2020-11-23 DIAGNOSIS — K284 Chronic or unspecified gastrojejunal ulcer with hemorrhage: Secondary | ICD-10-CM

## 2020-11-23 DIAGNOSIS — D473 Essential (hemorrhagic) thrombocythemia: Secondary | ICD-10-CM

## 2020-11-23 DIAGNOSIS — D500 Iron deficiency anemia secondary to blood loss (chronic): Secondary | ICD-10-CM

## 2020-11-23 LAB — RENAL FUNCTION PANEL
Albumin: 2.9 g/dL — ABNORMAL LOW (ref 3.5–5.0)
Anion gap: 4 — ABNORMAL LOW (ref 5–15)
BUN: 35 mg/dL — ABNORMAL HIGH (ref 6–20)
CO2: 26 mmol/L (ref 22–32)
Calcium: 8 mg/dL — ABNORMAL LOW (ref 8.9–10.3)
Chloride: 108 mmol/L (ref 98–111)
Creatinine, Ser: 1.46 mg/dL — ABNORMAL HIGH (ref 0.61–1.24)
GFR, Estimated: 56 mL/min — ABNORMAL LOW (ref >60)
Glucose, Bld: 111 mg/dL — ABNORMAL HIGH (ref 70–99)
Phosphorus: 3.4 mg/dL (ref 2.5–4.6)
Potassium: 3.7 mmol/L (ref 3.5–5.1)
Sodium: 138 mmol/L (ref 135–145)

## 2020-11-23 LAB — CBC
HCT: 24.5 % — ABNORMAL LOW (ref 39.0–52.0)
HCT: 26.7 % — ABNORMAL LOW (ref 39.0–52.0)
HCT: 22.9 % — ABNORMAL LOW (ref 39.0–52.0)
Hemoglobin: 8.1 g/dL — ABNORMAL LOW (ref 13.0–17.0)
Hemoglobin: 8.9 g/dL — ABNORMAL LOW (ref 13.0–17.0)
Hemoglobin: 7.7 g/dL — ABNORMAL LOW (ref 13.0–17.0)
MCH: 31.5 pg (ref 26.0–34.0)
MCH: 31.9 pg (ref 26.0–34.0)
MCH: 32.1 pg (ref 26.0–34.0)
MCHC: 33.1 g/dL (ref 30.0–36.0)
MCHC: 33.3 g/dL (ref 30.0–36.0)
MCHC: 33.6 g/dL (ref 30.0–36.0)
MCV: 95.3 fL (ref 80.0–100.0)
MCV: 95.7 fL (ref 80.0–100.0)
MCV: 95.4 fL (ref 80.0–100.0)
Platelets: 1262 10*3/uL (ref 150–400)
Platelets: 1419 10*3/uL (ref 150–400)
Platelets: 1090 10*3/uL (ref 150–400)
RBC: 2.57 MIL/uL — ABNORMAL LOW (ref 4.22–5.81)
RBC: 2.79 MIL/uL — ABNORMAL LOW (ref 4.22–5.81)
RBC: 2.4 MIL/uL — ABNORMAL LOW (ref 4.22–5.81)
RDW: 15.9 % — ABNORMAL HIGH (ref 11.5–15.5)
RDW: 15.9 % — ABNORMAL HIGH (ref 11.5–15.5)
RDW: 15.9 % — ABNORMAL HIGH (ref 11.5–15.5)
WBC: 12.5 10*3/uL — ABNORMAL HIGH (ref 4.0–10.5)
WBC: 12 10*3/uL — ABNORMAL HIGH (ref 4.0–10.5)
WBC: 9.4 10*3/uL (ref 4.0–10.5)
nRBC: 0.3 % — ABNORMAL HIGH (ref 0.0–0.2)
nRBC: 0.4 % — ABNORMAL HIGH (ref 0.0–0.2)
nRBC: 0.6 % — ABNORMAL HIGH (ref 0.0–0.2)

## 2020-11-23 LAB — BPAM RBC
Blood Product Expiration Date: 202204182359
ISSUE DATE / TIME: 202203291243
Unit Type and Rh: 9500

## 2020-11-23 LAB — TYPE AND SCREEN
ABO/RH(D): O NEG
Antibody Screen: NEGATIVE
Unit division: 0

## 2020-11-23 LAB — IRON AND TIBC
Iron: 46 ug/dL (ref 45–182)
Saturation Ratios: 15 % — ABNORMAL LOW (ref 17.9–39.5)
TIBC: 308 ug/dL (ref 250–450)
UIBC: 262 ug/dL

## 2020-11-23 LAB — PATHOLOGIST SMEAR REVIEW

## 2020-11-23 LAB — RETICULOCYTES
Immature Retic Fract: 29.2 % — ABNORMAL HIGH (ref 2.3–15.9)
RBC.: 2.51 MIL/uL — ABNORMAL LOW (ref 4.22–5.81)
Retic Count, Absolute: 103.2 10*3/uL (ref 19.0–186.0)
Retic Ct Pct: 4.1 % — ABNORMAL HIGH (ref 0.4–3.1)

## 2020-11-23 LAB — MRSA PCR SCREENING: MRSA by PCR: NEGATIVE

## 2020-11-23 LAB — FERRITIN: Ferritin: 18 ng/mL — ABNORMAL LOW (ref 24–336)

## 2020-11-23 MED ORDER — SODIUM CHLORIDE 0.9 % IV SOLN
510.0000 mg | Freq: Once | INTRAVENOUS | Status: AC
  Administered 2020-11-23: 510 mg via INTRAVENOUS
  Filled 2020-11-23: qty 17

## 2020-11-23 MED ORDER — MELATONIN 5 MG PO TABS
5.0000 mg | ORAL_TABLET | Freq: Every evening | ORAL | Status: DC | PRN
  Administered 2020-11-23: 5 mg via ORAL
  Filled 2020-11-23 (×2): qty 1

## 2020-11-23 NOTE — Progress Notes (Addendum)
HD#1 Subjective:  Overnight Events: No acute overnight events reported   Patient states that four days ago he began having diffuse epigastric pain. Pain improved slightly with food and had never had similar pain before. He began having dark tarry diarrhea, took Pepto bismol (after first dark stool, did not improve symptoms)  and shortness of breath and dizziness which resulted in his coming to the ED. Patient reports his BM was solid this morning and a dark Ackert color. His abdominal pain has improved this morning as has his SOB.   Patient denies HA, dizziness, lightheadedness, flushing, itching, SOB, CP, perifferal edema.   Objective:  Vital signs in last 24 hours: Vitals:   11/23/20 0102 11/23/20 0338 11/23/20 0800 11/23/20 1241  BP: (!) 108/50 117/68 123/77 125/68  Pulse: 81 77 89 79  Resp: '16 15 16 16  ' Temp: 98.3 F (36.8 C) 98.1 F (36.7 C) 97.9 F (36.6 C) 97.7 F (36.5 C)  TempSrc: Axillary Axillary Oral Oral  SpO2: 98% 100% 99% 99%  Weight:      Height:       Supplemental O2: Room Air SpO2: 99 %   Physical Exam:  Constitutional: tired appearing male sitting in bed, in no acute distress HENT: normocephalic atraumatic, mucous membranes moist Eyes: conjunctiva pallor, sclera anicteric  Neck: supple Cardiovascular: regular rate and rhythm, no m/r/g Pulmonary/Chest: normal work of breathing on room air, lungs clear to auscultation bilaterally Abdominal: soft, non-tender, non-distended MSK: normal bulk and tone Neurological: alert & oriented x 3 Psych: Appropriate   Filed Weights   11/22/20 1400  Weight: 106.6 kg     Intake/Output Summary (Last 24 hours) at 11/23/2020 1425 Last data filed at 11/23/2020 0900 Gross per 24 hour  Intake 1123 ml  Output 801 ml  Net 322 ml   Net IO Since Admission: 322 mL [11/23/20 1425]  Pertinent Labs: CBC Latest Ref Rng & Units 11/23/2020 11/23/2020 11/22/2020  WBC 4.0 - 10.5 K/uL 12.0(H) 12.5(H) 16.2(H)  Hemoglobin 13.0 -  17.0 g/dL 8.9(L) 8.1(L) 8.7(L)  Hematocrit 39.0 - 52.0 % 26.7(L) 24.5(L) 25.8(L)  Platelets 150 - 400 K/uL 1,419(HH) 1,262(HH) 1,788(HH)    CMP Latest Ref Rng & Units 11/23/2020 11/22/2020  Glucose 70 - 99 mg/dL 111(H) 171(H)  BUN 6 - 20 mg/dL 35(H) 60(H)  Creatinine 0.61 - 1.24 mg/dL 1.46(H) 1.71(H)  Sodium 135 - 145 mmol/L 138 137  Potassium 3.5 - 5.1 mmol/L 3.7 3.9  Chloride 98 - 111 mmol/L 108 107  CO2 22 - 32 mmol/L 26 21(L)  Calcium 8.9 - 10.3 mg/dL 8.0(L) 8.7(L)  Total Protein 6.5 - 8.1 g/dL - 6.1(L)  Total Bilirubin 0.3 - 1.2 mg/dL - 0.4  Alkaline Phos 38 - 126 U/L - 31(L)  AST 15 - 41 U/L - 16  ALT 0 - 44 U/L - 11    Imaging: No results found.  Assessment/Plan:   Active Problems:   GIB (gastrointestinal bleeding)   Patient Summary: Jermaine Russell is a 55 y.o. with a pertinent PMH of HTN, thrombocytosis, COVIDx3, CKD(?), who presented with diffuse abdominal pain, SOB, dizziness and dark tarry stool  and admitted for anemia due to GI bleed and thrombocytosis.   Symptomatic anemia 87/40 GIB: 55 year old male with a history of hypertension, thromboctosis and recent Covid infection who is presenting with a 3-day history of diffuse abdominal pain, melena, lightheadedness, and dizziness.  Takes Asprin ~ every 4 days, because he forgets to take it every day, no other NSAID  use. On initial exam patient was tachycardic, hypertensive, and appeared anemic. His Hgb was 9.3, down from 15 in Jan 22. FOBT +. Given 1L NS, protonix, and 1 unit pRBC in the ED. Repeat Hgb was 8.7, and came up to 8.9 this morning. Iron studies showed low ferritin of 18 with normal range iron and TIBC, and low saturation ratio of 15. Will check reticulocyte count and begin iron transfusion.   GI was consulted and EGD showed "one non-bleeding cratered gastric ulcer with a clean ulcer base (Forrest Class III) was found in the gastric antrum. The lesion was 10 mm in largest dimension and one non-bleeding  superficial duodenal ulcer with a clean ulcer base (Forrest Class III)  in the duodenal bulb." These ulcers were likely the source of the bleed. Will continue to follow CBC and if Hgb does not improve consider speaking to GI about further imaging.   -GI consulted, appreciate recommendations -Continue Protonix 80 mg IV BID -Daily CBC -SCDs for DVT prophylaxis -Hold ASA and hydroxyurea  -F/u reticulocyte count  -Follow up ristocetin   Likely essential thrombocytosis:  Plt 2357 on admission, patient reports this has been elevated since covid infection. Has seen hematology in the past, and reportedly had a bone marrow biopsy that patient reports was positive for JAK-2 positive. He was initially started on ASA daily and then was prescribed hydroxyurea 500 mg daily by an ED doctor. He has been taking it for ~1 month. Platelets currently 1419. Hematology was consulted. -Appreciate Hematology consult and reccomendations -Daily CBC -Holding home hydroxyurea 500 mg and ASA as per above   HTN: Patient on amlodipine 5 mg daily at home. He was prescribed metoprolol 25 mg daily however patient reports not taking this medication. BP uponelevated upon admission was 155/112. BP has been in normal range all day.   -Hold home amlodipine 5 mg daily -Monitor Bps   Acute on chronic CKD: Cr 1.7 with BUN of 60 on admission, 1.46 today with BUN of 35. Baseline unclear.  -Continue maintenance fluids -Daily BMP -Avoid nephrotoxic agents   Diet: Normal IVF: None,None VTE: SCDs Code: Full PT/OT recs: None, none.  Dispo: Anticipated discharge to Home in 2 days pending hematology consult, iron transfusion.   [place] Student  Pager [phone] Please contact the on call pager after 5 pm and on weekends at [phone].

## 2020-11-23 NOTE — Consult Note (Signed)
Jermaine Russell  [phone]   HEMATOLOGY ONCOLOGY INPATIENT CONSULTATION   Jsoeph Podesta  DOB: 1966/08/19  MR#: [id number]  CSN#: [id number]    Requesting Physician: internal medicine   Patient Care Team: Patient, No Pcp Per (Inactive) as PCP - General (General Practice)  Reason for consult: thrombocytosis   History of present illness:   Mr. Karner is a 55 yo male with past medical history of hypertension, Covid infection, and essential thrombocythemia, on Hydrea, was admitted for GI bleeding.  I was consulted for his thrombocytosis and management.  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  Per patient, he was diagnosed with essential thrombocythemia about 2 years ago.  He underwent bone marrow biopsy, and was tested positive for JAK2.  He denies any history of venous or arterial thrombosis.  He was recommended to start baby aspirin, but no additional treatment was recommended at that time.  He had recurrent Covid 3 times in the past 2 years, and when he was seen in ED about a month ago, he was found to have platelet count of admitting, and he was prescribed with Hydrea by ED physician.  He has been taking Hydrea for the past months.  He presents to our emergency department 2 days ago with abdominal pain, and a tarry stool for 3 to 4 days.  He was found to have moderate anemia with Hg 9.3 and platelet 2,357K. He received 1 unit blood transfusion.  GI was consulted, he underwent EGD, which showed nonbleeding gastric ulcer.  He has been on PPI.  Patient reports intermittent dizziness episodes in the past 2 years, he was seen by ENT, and the work-up was negative.  He has history of hypertension, on medication, and is supposed to see a cardiologist in the near future for echocardiogram and cardiac tests.  MEDICAL HISTORY:  Past Medical History:  Diagnosis Date  . Hypertension   . Renal disorder     SURGICAL HISTORY: Past Surgical History:  Procedure Laterality  Date  . BIOPSY  11/22/2020   Procedure: BIOPSY;  Surgeon: Carol Ada, MD;  Location: [facility];  Service: Endoscopy;;  . ESOPHAGOGASTRODUODENOSCOPY (EGD) WITH PROPOFOL N/A 11/22/2020   Procedure: ESOPHAGOGASTRODUODENOSCOPY (EGD) WITH PROPOFOL;  Surgeon: Carol Ada, MD;  Location: [facility];  Service: Endoscopy;  Laterality: N/A;    SOCIAL HISTORY: Social History   Socioeconomic History  . Marital status: Married    Spouse name: Not on file  . Number of children: Not on file  . Years of education: Not on file  . Highest education level: Not on file  Occupational History  . Not on file  Tobacco Use  . Smoking status: Never Smoker  . Smokeless tobacco: Never Used  Vaping Use  . Vaping Use: Never used  Substance and Sexual Activity  . Alcohol use: Not Currently  . Drug use: Not Currently  . Sexual activity: Not on file  Other Topics Concern  . Not on file  Social History Narrative  . Not on file   Social Determinants of Health   Financial Resource Strain: Not on file  Food Insecurity: Not on file  Transportation Needs: Not on file  Physical Activity: Not on file  Stress: Not on file  Social Connections: Not on file  Intimate Partner Violence: Not on file    FAMILY HISTORY: History reviewed. No pertinent family history.  ALLERGIES:  has No Known Allergies.  MEDICATIONS:  Current Facility-Administered Medications  Medication Dose Route Frequency Provider  Last Rate Last Admin  . 0.9 %  sodium chloride infusion  10 mL/hr Intravenous Once Asencion Noble, MD   Held at 11/22/20 1055  . acetaminophen (TYLENOL) tablet 650 mg  650 mg Oral Q6H PRN Asencion Noble, MD       Or  . acetaminophen (TYLENOL) suppository 650 mg  650 mg Rectal Q6H PRN Asencion Noble, MD      . melatonin tablet 5 mg  5 mg Oral QHS PRN Iona Beard, MD      . pantoprazole (PROTONIX) EC tablet 80 mg  80 mg Oral BID Sherry Ruffing, Marissa M, MD   80 mg at 11/23/20 3818    REVIEW OF  SYSTEMS:   Constitutional: Denies fevers, chills or abnormal night sweats, (+) fatigue  Eyes: Denies blurriness of vision, double vision or watery eyes Ears, nose, mouth, throat, and face: Denies mucositis or sore throat Respiratory: Denies cough, dyspnea or wheezes Cardiovascular: Denies palpitation, chest discomfort or lower extremity swelling Gastrointestinal:  Denies nausea, heartburn, recent tarry stool  Skin: Denies abnormal skin rashes Lymphatics: Denies new lymphadenopathy or easy bruising Neurological:Denies numbness, tingling or new weaknesses, (+) intermittent dizziness Behavioral/Psych: Mood is stable, no new changes  All other systems were reviewed with the patient and are negative.  PHYSICAL EXAMINATION: ECOG PERFORMANCE STATUS: 1 - Symptomatic but completely ambulatory  Vitals:   11/23/20 1241 11/23/20 1641  BP: 125/68 136/61  Pulse: 79 83  Resp: 16 16  Temp: 97.7 F (36.5 C) 99 F (37.2 C)  SpO2: 99% 94%   Filed Weights   11/22/20 1400  Weight: 235 lb (106.6 kg)    GENERAL:alert, no distress and comfortable SKIN: skin color, texture, turgor are normal, no rashes or significant lesions EYES: normal, conjunctiva are pink and non-injected, sclera clear OROPHARYNX:no exudate, no erythema and lips, buccal mucosa, and tongue normal  NECK: supple, thyroid normal size, non-tender, without nodularity LYMPH:  no palpable lymphadenopathy in the cervical, axillary or inguinal LUNGS: clear to auscultation and percussion with normal breathing effort HEART: regular rate & rhythm and no murmurs and no lower extremity edema ABDOMEN:abdomen soft, non-tender and normal bowel sounds Musculoskeletal:no cyanosis of digits and no clubbing  PSYCH: alert & oriented x 3 with fluent speech NEURO: no focal motor/sensory deficits  LABORATORY DATA:  I have reviewed the data as listed Lab Results  Component Value Date   WBC 12.0 (H) 11/23/2020   HGB 8.9 (L) 11/23/2020   HCT 26.7  (L) 11/23/2020   MCV 95.7 11/23/2020   PLT 1,419 (HH) 11/23/2020   Recent Labs    11/22/20 0834 11/23/20 0142  NA 137 138  K 3.9 3.7  CL 107 108  CO2 21* 26  GLUCOSE 171* 111*  BUN 60* 35*  CREATININE 1.71* 1.46*  CALCIUM 8.7* 8.0*  GFRNONAA 47* 56*  PROT 6.1*  --   ALBUMIN 3.5 2.9*  AST 16  --   ALT 11  --   ALKPHOS 31*  --   BILITOT 0.4  --     RADIOGRAPHIC STUDIES: I have personally reviewed the radiological images as listed and agreed with the findings in the report. CT Abdomen Pelvis W Contrast  Result Date: 11/22/2020 CLINICAL DATA:  Abdominal pain with diarrhea for 3 days, near syncopal feeling with activities EXAM: CT ABDOMEN AND PELVIS WITH CONTRAST TECHNIQUE: Multidetector CT imaging of the abdomen and pelvis was performed using the standard protocol following bolus administration of intravenous contrast. Sagittal and coronal MPR images reconstructed  from axial data set. CONTRAST:  152m OMNIPAQUE IOHEXOL 300 MG/ML SOLN IV. No oral contrast. COMPARISON:  None. FINDINGS: Lower chest: Lung bases clear. Hepatobiliary: Gallbladder and liver normal appearance Pancreas: Normal appearance Spleen: Normal appearance Adrenals/Urinary Tract: Adrenal glands, kidneys, ureters, and bladder normal appearance Stomach/Bowel: Appendix not visualized. Stomach under distended with suboptimal assessment of wall thickness. Bowel loops normal appearance Vascular/Lymphatic: Few pelvic phleboliths. Minimal atherosclerotic calcification aorta and iliac arteries. Aorta normal caliber. No adenopathy. Reproductive: Prostate gland and seminal vesicles unremarkable. Other: No free air or free fluid. Tiny RIGHT inguinal hernia containing fat. No acute inflammatory process. Musculoskeletal: Retrolisthesis L5-S1 with endplate spur formation and bulging disc. Degenerative changes LEFT hip joint. IMPRESSION: No acute intra-abdominal or intrapelvic abnormalities. Tiny RIGHT inguinal hernia containing fat. Aortic  Atherosclerosis (ICD10-I70.0). Electronically Signed   By: MLavonia DanaM.D.   On: 11/22/2020 12:35   DG Chest Port 1 View  Result Date: 11/22/2020 CLINICAL DATA:  Shortness of breath.  Abdominal pain EXAM: PORTABLE CHEST 1 VIEW COMPARISON:  None. FINDINGS: Normal heart size and mediastinal contours. No acute infiltrate or edema. No effusion or pneumothorax. No acute osseous findings. IMPRESSION: Negative chest. Electronically Signed   By: JMonte FantasiaM.D.   On: 11/22/2020 09:58    ASSESSMENT & PLAN: 55yo male with past medical history of hypertension, Covid infection, and essential thrombocythemia, on Hydrea, was admitted for GI bleeding.   1. GI bleeding from gastric ulcer  2.  Anemia from GI bleeding and iron deficiency 3.  Essential thrombocythemia 4.  Component of reactive thrombocytosis from iron deficiency 5. HTN  6. Intermittent dizziness    Recommendations: -although he is outside medical records not available, patient is quite clear that he had a JAK2 mutation positive, and had a bone marrow biopsy 2 years ago.  So I think he is thrombocytosis is from ET.  He probably has component of reactive thrombocytosis from iron deficiency also. -I recommend IV iron, which will help with his anemia and thrombocytosis. -Given his age (<60) and no prior history of thrombosis, he is at low risk for thrombosis from his ET standpoint. We typically do not offer cytoreduction therapy such as hydrea. However his plt counts is over 1854mlon/ul, which can cause acquited von Willebrand disease and subsequently because bleeding. This would be an indication for cytoreduction therapy.  Not sure if his GI bleeding is related to that,or secondary to hydrea which can cause ulcers too.  -I will order VWD panel.  -I recommend holding hydrea  -I recommend him to try Anagrelide. It's a very effective medicine for ET, but does slightly increase risk of heart disease. I recommend him to f/u with cardiology (he  already has an appointment with cardiology). He can start at 54m14maily. Please call in on discharge  -He needs repeated CBC in 2 to 4 weeks.  I strongly recommend [place]  All questions were answered. The patient knows to call the clinic with any problems, questions or concerns.      YanTruitt MerleD 11/23/2020

## 2020-11-23 NOTE — Progress Notes (Signed)
Date: 11/23/2020  Patient name: Jermaine Russell  Medical record number: [id number]  Date of birth: December 03, 1965   I have seen and evaluated Adrian Prows and discussed their care with the Residency Team.  In brief, patient is a 55 year old male with a past medical history of hypertension, recurrent infections with Covid and thrombocytosis who presented to the ED with abdominal pain and black tarry stools for 3 days prior to admission.  Patient states that approximately 3 days prior to admission he developed diffuse abdominal pain which he described as an aching sensation which is worse with walking and driving.  Patient states that he took Pepto-Bismol for this with minimal relief.  He also noted lightheadedness especially when ambulating as well as dark tarry stools.  He also complained of some shortness of breath with exertion.  No chest pain, no palpitations, no syncope, no focal weakness, no tingling or numbness, no headache, no blurry vision, no fevers or chills.  Patient is on aspirin for his essential thrombocytosis which he only takes every 4 to 5 days.  He is also on hydroxyurea for this.  Patient states that he had a bone marrow biopsy done which showed that he was JAK2 positive.  Today, patient states that he feels better and that he had a bowel movement this morning which was dark [place] instead of black.  PMHx, Fam Hx, and/or Soc Hx : As per resident admit note  Vitals:   11/23/20 1241 11/23/20 1641  BP: 125/68 136/61  Pulse: 79 83  Resp: 16 16  Temp: 97.7 F (36.5 C) 99 F (37.2 C)  SpO2: 99% 94%   General: Awake, alert, oriented x3, NAD CVS: Regular rate and rhythm, normal heart sounds Lungs: CTA bilaterally Abdomen: Soft, nontender, nondistended, normoactive bowel sounds Extremities: No edema noted, nontender to palpation Psych: Normal mood affect HEENT: Normocephalic, atraumatic Skin: Warm and dry  Assessment and Plan: I have seen and evaluated the patient as  outlined above. I agree with the formulated Assessment and Plan as detailed in the residents' note, with the following changes:   1.  Acute blood loss anemia secondary to likely GI bleed: -Patient presented to ED with abdominal pain, lightheadedness and dark tarry stools for 3 days with a positive FOBT on admission consistent with a GI bleed.  Patient's hemoglobin was down to 9.3 from approximately 15 in January consistent with acute blood loss anemia. -Patient status post EGD which showed a nonbleeding gastric and duodenal ulcer. -Continue with PPI twice daily for 1 month and then transition to daily PPI -We will follow up path results for H. pylori.  If positive will initiate quadruple therapy -Patient's received 1 unit PRBC on admission but hemoglobin down trended from 9.3 to 8.1 this a.m.  However, patient with no evidence of active bleed currently and repeat CBC showed hemoglobin of 8.9 -We will continue to monitor closely -If patient has worsening hemoglobin will discuss possible further work-up with GI.  However, patient's gastric and duodenal ulcers are the likely source of his bleed. -Continue with SCDs for DVT prophylaxis -We will continue to hold aspirin hydroxyurea for now.  Patient still noted to have elevated platelet count over thousand.  We will discuss with hematology as to when to resume his hydroxyurea and aspirin -Patient also noted to have AKI on CKD on admission likely prerenal secondary to blood loss.  Patient's creatinine was 1.7 on admission (baseline of approximately 1.3-1.5).  Patient's creatinine has improved to 1.46 today which is  close to his baseline.  We will continue to monitor closely. -No further work-up at this time if patient continues to improve he will likely be stable for DC home tomorrow  Aldine Contes, MD 3/30/20225:44 PM

## 2020-11-24 ENCOUNTER — Telehealth: Payer: Self-pay

## 2020-11-24 ENCOUNTER — Other Ambulatory Visit: Payer: Self-pay | Admitting: Student

## 2020-11-24 DIAGNOSIS — D509 Iron deficiency anemia, unspecified: Secondary | ICD-10-CM

## 2020-11-24 DIAGNOSIS — N179 Acute kidney failure, unspecified: Secondary | ICD-10-CM

## 2020-11-24 LAB — CBC
HCT: 26.7 % — ABNORMAL LOW (ref 39.0–52.0)
Hemoglobin: 9.1 g/dL — ABNORMAL LOW (ref 13.0–17.0)
MCH: 32.7 pg (ref 26.0–34.0)
MCHC: 34.1 g/dL (ref 30.0–36.0)
MCV: 96 fL (ref 80.0–100.0)
Platelets: 1579 10*3/uL (ref 150–400)
RBC: 2.78 MIL/uL — ABNORMAL LOW (ref 4.22–5.81)
RDW: 15.9 % — ABNORMAL HIGH (ref 11.5–15.5)
WBC: 13.9 10*3/uL — ABNORMAL HIGH (ref 4.0–10.5)
nRBC: 1.2 % — ABNORMAL HIGH (ref 0.0–0.2)

## 2020-11-24 LAB — CBC WITH DIFFERENTIAL/PLATELET
Abs Immature Granulocytes: 0.33 10*3/uL — ABNORMAL HIGH (ref 0.00–0.07)
Basophils Absolute: 0.2 10*3/uL — ABNORMAL HIGH (ref 0.0–0.1)
Basophils Relative: 2 %
Eosinophils Absolute: 0.1 10*3/uL (ref 0.0–0.5)
Eosinophils Relative: 1 %
HCT: 24.7 % — ABNORMAL LOW (ref 39.0–52.0)
Hemoglobin: 8.1 g/dL — ABNORMAL LOW (ref 13.0–17.0)
Immature Granulocytes: 4 %
Lymphocytes Relative: 12 %
Lymphs Abs: 1.1 10*3/uL (ref 0.7–4.0)
MCH: 31.8 pg (ref 26.0–34.0)
MCHC: 32.8 g/dL (ref 30.0–36.0)
MCV: 96.9 fL (ref 80.0–100.0)
Monocytes Absolute: 0.8 10*3/uL (ref 0.1–1.0)
Monocytes Relative: 9 %
Neutro Abs: 6.6 10*3/uL (ref 1.7–7.7)
Neutrophils Relative %: 72 %
Platelets: 1161 10*3/uL (ref 150–400)
RBC: 2.55 MIL/uL — ABNORMAL LOW (ref 4.22–5.81)
RDW: 15.9 % — ABNORMAL HIGH (ref 11.5–15.5)
WBC: 9.1 10*3/uL (ref 4.0–10.5)
nRBC: 0.7 % — ABNORMAL HIGH (ref 0.0–0.2)

## 2020-11-24 LAB — RENAL FUNCTION PANEL
Albumin: 2.8 g/dL — ABNORMAL LOW (ref 3.5–5.0)
Anion gap: 4 — ABNORMAL LOW (ref 5–15)
BUN: 23 mg/dL — ABNORMAL HIGH (ref 6–20)
CO2: 25 mmol/L (ref 22–32)
Calcium: 8.1 mg/dL — ABNORMAL LOW (ref 8.9–10.3)
Chloride: 111 mmol/L (ref 98–111)
Creatinine, Ser: 1.36 mg/dL — ABNORMAL HIGH (ref 0.61–1.24)
GFR, Estimated: >60 mL/min (ref >60)
Glucose, Bld: 119 mg/dL — ABNORMAL HIGH (ref 70–99)
Phosphorus: 4 mg/dL (ref 2.5–4.6)
Potassium: 3.8 mmol/L (ref 3.5–5.1)
Sodium: 140 mmol/L (ref 135–145)

## 2020-11-24 LAB — SURGICAL PATHOLOGY

## 2020-11-24 MED ORDER — PANTOPRAZOLE SODIUM 40 MG PO TBEC: 40.0000 mg | DELAYED_RELEASE_TABLET | Freq: Two times a day (BID) | ORAL | 0 refills | Status: AC

## 2020-11-24 MED ORDER — ANAGRELIDE HCL 0.5 MG PO CAPS
1.0000 mg | ORAL_CAPSULE | Freq: Every day | ORAL | Status: DC
  Administered 2020-11-24: 1 mg via ORAL
  Filled 2020-11-24: qty 2

## 2020-11-24 MED ORDER — ANAGRELIDE HCL 1 MG PO CAPS
1.0000 mg | ORAL_CAPSULE | Freq: Every day | ORAL | 0 refills | Status: AC
Start: 2020-11-25 — End: ?

## 2020-11-24 MED FILL — PANTOPRAZOLE SOD DR 40 MG T: 40 | 30 days supply | Qty: 60 | Fill #0

## 2020-11-24 MED FILL — ANAGRELIDE HCL 0.5 MG CAP: 0.5 | 30 days supply | Qty: 60 | Fill #0

## 2020-11-24 NOTE — Telephone Encounter (Signed)
Dr Konrad Penta requested a Hospital f/u  For pt being discharged today .. she stated he needed a week f/u so he was given 12/01/20@ 9:45 with her

## 2020-11-24 NOTE — Discharge Instructions (Signed)
Gastrointestinal Bleeding Gastrointestinal (GI) bleeding is bleeding somewhere along the digestive tract, between the mouth and the anus. This tract includes the mouth, esophagus, stomach, small intestine, large intestine, and anus. The large intestine is often called the colon. GI bleeding can be caused by various problems. The severity of these problems can range from mild to serious or even life-threatening. If you have GI bleeding, you may find blood in your stools (feces), you may have black stools, or you may vomit blood. If there is a lot of bleeding, you may need to stay in the hospital. What are the causes? This condition may be caused by:  Inflammation, irritation, or swelling of the esophagus (esophagitis). The esophagus is part of the body that moves food from your mouth to your stomach.  Swollen veins in the rectum (hemorrhoids).  Areas of painful tearing in the anus that are often caused by passing hard stool (anal fissures).  Pouches that form on the colon over time, with age, and may bleed a lot (diverticulosis).  Inflammation (diverticulitis) in areas with diverticulosis. This can cause pain, fever, and bloody stools, although bleeding may be mild.  Growths (polyps) or cancer. Colon cancer often starts out as precancerous polyps.  Gastritis and ulcers. With these, bleeding may come from the upper GI tract, near the stomach. What increases the risk? You are more likely to develop this condition if you:  Have an infection in your stomach from a type of bacteria called Helicobacter pylori.  Take certain medicines, such as: ? NSAIDs. ? Aspirin. ? Selective serotonin reuptake inhibitors (SSRIs). ? Steroids. ? Antiplatelet or anticoagulant medicines.  Smoke.  Drink alcohol. What are the signs or symptoms? Common symptoms of this condition include:  Bright red blood in your vomit, or vomit that looks like coffee grounds.  Bloody, black, or tarry stools. ? Bleeding  from the lower GI tract will usually cause red or maroon blood in the stools. ? Bleeding from the upper GI tract may cause black, tarry stools that are often stronger smelling than usual. ? In certain cases, if the bleeding is fast enough, the stools may be red.  Pain or cramping in the abdomen. How is this diagnosed? This condition may be diagnosed based on:  Your medical history and a physical exam.  Various tests, such as: ? Blood tests. ? Stool tests. ? X-rays and other imaging tests. ? Esophagogastroduodenoscopy (EGD). In this test, a flexible, lighted tube is used to look at your esophagus, stomach, and small intestine. ? Colonoscopy. In this test, a flexible, lighted tube is used to look at your colon. How is this treated? Treatment for this condition depends on the cause of the bleeding. For example:  For bleeding from the esophagus, stomach, small intestine, or colon, the health care provider doing your EGD or colonoscopy may be able to stop the bleeding as part of the procedure.  Inflammation or infection of the colon can be treated with medicines.  Certain rectal problems can be treated with creams, suppositories, or warm baths.  Medicines may be given to reduce acid in your stomach.  Surgery is sometimes needed.  Blood transfusions are sometimes needed if a lot of blood has been lost. If bleeding is mild, you may be allowed to go home. If there is a lot of bleeding, you will need to stay in the hospital for observation. Follow these instructions at home:  Take over-the-counter and prescription medicines only as told by your health care provider.  Eat   foods that are high in fiber, such as beans, whole grains, and fresh fruits and vegetables. This will help to keep your stools soft. Eating 1-3 prunes each day works well for many people.  Drink enough fluid to keep your urine pale yellow.  Keep all follow-up visits as told by your health care provider. This is  important.   Contact a health care provider if:  Your symptoms do not improve. Get help right away if:  Your bleeding does not stop.  You feel light-headed or you faint.  You feel weak.  You have severe cramps in your back or abdomen.  You pass large blood clots in your stool.  Your symptoms are getting worse.  You have chest pain or fast heartbeats. Summary  Gastrointestinal (GI) bleeding is bleeding somewhere along the digestive tract, between the mouth and anus. GI bleeding can be caused by various problems. The severity of these problems can range from mild to serious or even life-threatening.  Treatment for this condition depends on the cause of the bleeding.  Take over-the-counter and prescription medicines only as told by your health care provider.  Keep all follow-up visits as told by your health care provider. This is important.  Get help right away if your bleeding increases, your symptoms are getting worse, or you have new symptoms. This information is not intended to replace advice given to you by your health care provider. Make sure you discuss any questions you have with your health care provider. Document Revised: 03/26/2018 Document Reviewed: 03/26/2018 Elsevier Patient Education  2021 [employer]  

## 2020-11-24 NOTE — Plan of Care (Signed)

## 2020-11-24 NOTE — Discharge Summary (Signed)
Name: Jermaine Russell MRN: [id number] DOB: April 29, 1966 55 y.o. PCP: Patient, No Pcp Per (Inactive)  Date of Admission: 11/22/2020  8:29 AM Date of Discharge: 11/24/2020 Attending Physician: Dr. Dareen Piano  Discharge Diagnosis: Active Problems:   GIB (gastrointestinal bleeding)   Essential thrombocythemia [facility])    Discharge Medications: Allergies as of 11/24/2020   No Known Allergies     Medication List    STOP taking these medications   amLODipine 10 MG tablet Commonly known as: NORVASC   hydroxyurea 500 MG capsule Commonly known as: HYDREA     TAKE these medications   anagrelide 1 MG capsule Commonly known as: AGRYLIN Take 1 capsule (1 mg total) by mouth daily. Start taking on: November 25, 2020   pantoprazole 40 MG tablet Commonly known as: PROTONIX Take 1 tablet (40 mg total) by mouth 2 (two) times daily. Take 30 minutes prior to breakfast and dinner.       Disposition and follow-up:   [name] was discharged from [facility] in Good condition.  At the hospital follow up visit please address:  1.  Follow-up:  A. GI Bleed: Check CBC, patient should not be taking ASA or NSAID's. Protonix 40 mg BID for 1 month then taper.    B. Essential thrombocytosis: Patient started on anagrelide 1 mg qd. Can cause cardiac damage and patient needs to f/u with cardiologist and hematologist in [place]. Hydroxyurea and ASA have been d/c'd. Follow up on VWF panel.    C. IDA: Patient had iron transfusion 3/30 and will likely need another, please check iron studies.    D. HTN: Holding amlodipine due to some low BP readings, BP will need evaluating at f/u. Consider restarting amlodipine or switching to ACE/ARB for renal protection.   E. AKI: Likely acute on chronic, check renal panel.   2.  Labs / imaging needed at time of follow-up: CBC w/diff, renal panel, iron studies   3.  Pending labs/ test needing follow-up: VWB panel   4.  Medication  Changes  Started: Anagrelide 1 mg qd, Protonix 40 mg BID  Stopped: Hydroxyurea, ASA, Amlodipine    Follow-up Appointments:  Follow-up Information    Jeralyn Bennett, MD. Go on 12/01/2020.   Specialty: Internal Medicine Why: Please bring your ID card and insurance information to your appointment 12/01/20 or call to reschedule. Contact information: [place] [address] [place] Hospital Course by problem list:  Symptomatic anemia 59/57 GIB: 55 year old male with a history of hypertension, thromboctosis and recent Covid infection who presented with a 3-day history of diffuse abdominal pain, melena, lightheadedness, and dizziness.  Was taking Asprin ~ every 4 days, because he forgets to take it every day, no other NSAID use. On initial exam patient was tachycardic, hypertensive, and appeared anemic. His Hgb was 9.3, down from 15 in Jan 22. FOBT +. Given 1L NS, IV protonix, and 1 unit pRBC in the ED.GI was consulted and EGD showed "one non-bleeding cratered gastric ulcer with a clean ulcer base (Forrest Class III) was found in the gastric antrum. The lesion was 10 mm in largest dimension and one non-bleeding superficial duodenal ulcer with a clean ulcer base (Forrest Class III)  in the duodenal bulb." These ulcers were likely the source of the bleed. Hydroxyurea and ASA were stopped at admission, both which could have contributed to ulcer formation. Hgb remained stable between 7.7-8.9, hgb  of 8.1 on discharge. Patients BM are no longer black or tarry and epigastric pain, lightheadedness, dizziness and tachycardia have resolved. Patient appears stable and there are no signs of a persistent bleed.    Iron studies showed low ferritin of 18 with normal range iron and TIBC, and low saturation ratio of 15. IDA possibly contributing to thrombocytosis. Patient received ferumoxytol 510 mg transfusion on 11/23/2020.    Likely essential thrombocytosis:  Plt 2357 on admission,  patient reports this has been elevated since covid infection. Has seen hematology in the past, and reportedly had a bone marrow biopsy that patient reports was JAK-2 positive. He was initially started on ASA daily and then was prescribed hydroxyurea 500 mg daily by an ED doctor. He has been taking it for ~1 month. Hematology was consulted and recommended stopping hydroxyurea and ASA and starting patient on anagrelide 1 mg qd. Von Willebrand panel pending at time of discharge, ordered out of concern for acquired VWD due to ET. Platelets 1,161 at time of discharge.    HTN: Patient on amlodipine 5 mg daily at home. He was prescribed metoprolol 25 mg daily however patient reports not taking this medication. BP uponelevated upon admission was 155/112. BP has been labile since admission with low of 103/56 and high of 160/79. Amlodipine was held through admission.   Acute on chronic CKD: Cr 1.7 with BUN of 60 on admission, has been down trending 1.36 at time of discharge. Baseline is unclear.-Daily BMP -Avoid nephrotoxic agents    Discharge Subjective: Patient states he feels better today. His energy has increased. States he has BM this morning that was dark Esh in color. States he feels ready to go home today. He will be staying in [place] for the next couple weeks and is amenable to seeing IM clinic for hospital follow-up. Is aware that he will need to see cardiology and hematology when he returns to [place].   Denies HA, dizziness, lightheadedness, SOB, CP, abdominal pain, melena    Discharge Exam:   BP (!) 142/71 (BP Location: Right Wrist)   Pulse 97   Temp 98.4 F (36.9 C) (Oral)   Resp 20   Ht '6\' 2"'  (1.88 m)   Wt 106.6 kg   SpO2 95%   BMI 30.17 kg/m  Constitutional: well-appearing male sitting in bed, in no acute distress HENT: normocephalic atraumatic, mucous membranes moist Eyes: conjunctiva non-erythematous Neck: supple Cardiovascular: regular rate and rhythm, no  m/r/g Pulmonary/Chest: normal work of breathing on room air, lungs clear to auscultation bilaterally Abdominal: soft, non-tender, non-distended MSK: normal bulk and tone Neurological: alert & oriented x 3 Psych: Appropriate   Pertinent Labs, Studies, and Procedures:  CBC Latest Ref Rng & Units 11/24/2020 11/24/2020 11/23/2020  WBC 4.0 - 10.5 K/uL 13.9(H) 9.1 9.4  Hemoglobin 13.0 - 17.0 g/dL 9.1(L) 8.1(L) 7.7(L)  Hematocrit 39.0 - 52.0 % 26.7(L) 24.7(L) 22.9(L)  Platelets 150 - 400 K/uL 1,579(HH) 1,161(HH) 1,090(HH)    CMP Latest Ref Rng & Units 11/24/2020 11/23/2020 11/22/2020  Glucose 70 - 99 mg/dL 119(H) 111(H) 171(H)  BUN 6 - 20 mg/dL 23(H) 35(H) 60(H)  Creatinine 0.61 - 1.24 mg/dL 1.36(H) 1.46(H) 1.71(H)  Sodium 135 - 145 mmol/L 140 138 137  Potassium 3.5 - 5.1 mmol/L 3.8 3.7 3.9  Chloride 98 - 111 mmol/L 111 108 107  CO2 22 - 32 mmol/L 25 26 21(L)  Calcium 8.9 - 10.3 mg/dL 8.1(L) 8.0(L) 8.7(L)  Total Protein 6.5 - 8.1 g/dL - - 6.1(L)  Total  Bilirubin 0.3 - 1.2 mg/dL - - 0.4  Alkaline Phos 38 - 126 U/L - - 31(L)  AST 15 - 41 U/L - - 16  ALT 0 - 44 U/L - - 11    CT Abdomen Pelvis W Contrast  Result Date: 11/22/2020 CLINICAL DATA:  Abdominal pain with diarrhea for 3 days, near syncopal feeling with activities EXAM: CT ABDOMEN AND PELVIS WITH CONTRAST TECHNIQUE: Multidetector CT imaging of the abdomen and pelvis was performed using the standard protocol following bolus administration of intravenous contrast. Sagittal and coronal MPR images reconstructed from axial data set. CONTRAST:  117m OMNIPAQUE IOHEXOL 300 MG/ML SOLN IV. No oral contrast. COMPARISON:  None. FINDINGS: Lower chest: Lung bases clear. Hepatobiliary: Gallbladder and liver normal appearance Pancreas: Normal appearance Spleen: Normal appearance Adrenals/Urinary Tract: Adrenal glands, kidneys, ureters, and bladder normal appearance Stomach/Bowel: Appendix not visualized. Stomach under distended with suboptimal assessment of  wall thickness. Bowel loops normal appearance Vascular/Lymphatic: Few pelvic phleboliths. Minimal atherosclerotic calcification aorta and iliac arteries. Aorta normal caliber. No adenopathy. Reproductive: Prostate gland and seminal vesicles unremarkable. Other: No free air or free fluid. Tiny RIGHT inguinal hernia containing fat. No acute inflammatory process. Musculoskeletal: Retrolisthesis L5-S1 with endplate spur formation and bulging disc. Degenerative changes LEFT hip joint. IMPRESSION: No acute intra-abdominal or intrapelvic abnormalities. Tiny RIGHT inguinal hernia containing fat. Aortic Atherosclerosis (ICD10-I70.0). Electronically Signed   By: MLavonia DanaM.D.   On: 11/22/2020 12:35   DG Chest Port 1 View  Result Date: 11/22/2020 CLINICAL DATA:  Shortness of breath.  Abdominal pain EXAM: PORTABLE CHEST 1 VIEW COMPARISON:  None. FINDINGS: Normal heart size and mediastinal contours. No acute infiltrate or edema. No effusion or pneumothorax. No acute osseous findings. IMPRESSION: Negative chest. Electronically Signed   By: JMonte FantasiaM.D.   On: 11/22/2020 09:58     Discharge Instructions: Discharge Instructions    Call MD for:  difficulty breathing, headache or visual disturbances   Complete by: As directed    Call MD for:  extreme fatigue   Complete by: As directed    Call MD for:  persistant dizziness or light-headedness   Complete by: As directed    Call MD for:  persistant nausea and vomiting   Complete by: As directed    Call MD for:  severe uncontrolled pain   Complete by: As directed    Call MD for:  temperature >100.4   Complete by: As directed    Diet - low sodium heart healthy   Complete by: As directed    Discharge instructions   Complete by: As directed    Mr. BJahziel, Sinnwere admitted to the hospital for symptomatic anemia. You were found to have a stomach and a small bowel ulcer. Both of which were not actively bleeding during your scope; however, these may very  well be the cause of your recent dark, tarry stools. Hydroxyurea is known to increase your risk of ulcers as well as high platelet counts, which can cause von Willebrand Disease that can also increase your risk of bleeding. You received 1 blood transfusion and 1 iron transfusion in the hospital.   You were evaluated by GI and hematology in the hospital who recommend you make the following changes upon discharge home:  START taking:  - Anagrelide 1 tablet daily  - Protonix 1 tablet 30 minutes before breakfast and 1 tablet 30 minutes prior to dinner   STOP taking:  - Amlodipine (given lower pressures in the setting of  your GI bleeding)  - Hydroxyurea (as this can contribute to your stomach ulcers)  - Aspirin (as this increases your risk of bleeding on anagrelide)  Please take these medications daily as prescribed. I have scheduled you for a follow up visit in our Internal Medicine Clinic [facility]) on the ground floor of [facility] hospital with myself at 9:45am on Thursday, 12/01/20. It will be important that you schedule a follow up appointment with your hematologist and be sure to attend your appointment with your cardiologist once you get back to [place] for evaluation, as anagrelide can put you at increased risk of heart disease in the future.   If find that you are unable to attend your appointment or experience recurrent dark, tarry, or bloody stools, fevers, chills, abdominal pain, nausea, vomiting, chest pain, severe weakness, fatigue or shortness of breath, please call [facility] at [phone] or go to the ED.   It was a pleasure caring for you and take care,  Dr. Konrad Penta   Increase activity slowly   Complete by: As directed       Signed: Claudie Revering, Medical Student 11/24/2020, 3:56 PM   Pager: [phone]

## 2020-11-25 LAB — VON WILLEBRAND PANEL
Coagulation Factor VIII: 88 % (ref 56–140)
Ristocetin Co-factor, Plasma: 43 % — ABNORMAL LOW (ref 50–200)
Von Willebrand Antigen, Plasma: 81 % (ref 50–200)

## 2020-11-25 LAB — COAG STUDIES INTERP REPORT

## 2020-11-30 NOTE — Telephone Encounter (Addendum)
Call placed to patient for TOC. States he feels better and is back to work. 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 States he has started the protonix and anagrelide. He has stopped taking amlodipine, Hydroxyurea and ASA as instructed. He has made a f/u appt with Cardiology in [facility]. He has no questions or concerns at this time.

## 2020-12-01 ENCOUNTER — Encounter: Payer: 59 | Admitting: Student

## 2021-09-21 IMAGING — CT CT ABD-PELV W/ CM
2 of 5 series · 17 of 46 positions shown, 19 images · IV contrast (APPLIED)
Comparison: None.

CLINICAL DATA: Abdominal pain with diarrhea for 3 days, near
syncopal feeling with activities

EXAM:
CT ABDOMEN AND PELVIS WITH CONTRAST
TECHNIQUE: Multidetector CT imaging of the abdomen and pelvis was performed
using the standard protocol following bolus administration of
intravenous contrast. Sagittal and coronal MPR images reconstructed
from axial data set.
CONTRAST:  100mL OMNIPAQUE IOHEXOL 300 MG/ML SOLN IV. No oral
contrast.

[Series 4: abd/ pelvis 5.0 i30f 2 · axial · 0.91mm/px · z∈[+701,+1146]mm · 14 of 101 slices shown, 16 images]
[im 6/101  soft-tissue]
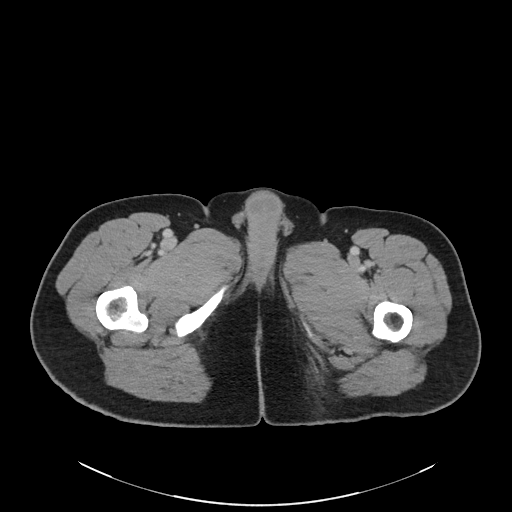
[im 6/101  bone]
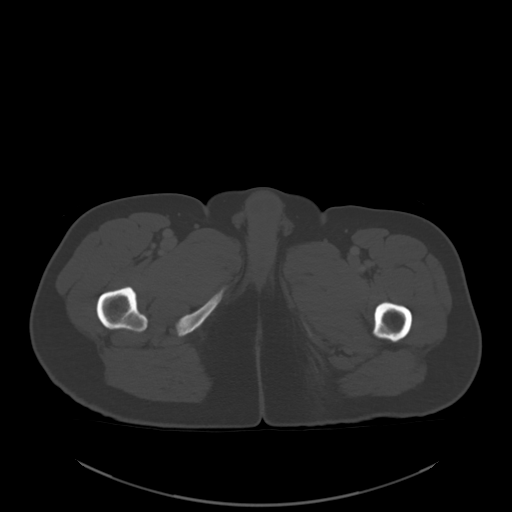
[im 12/101  soft-tissue]
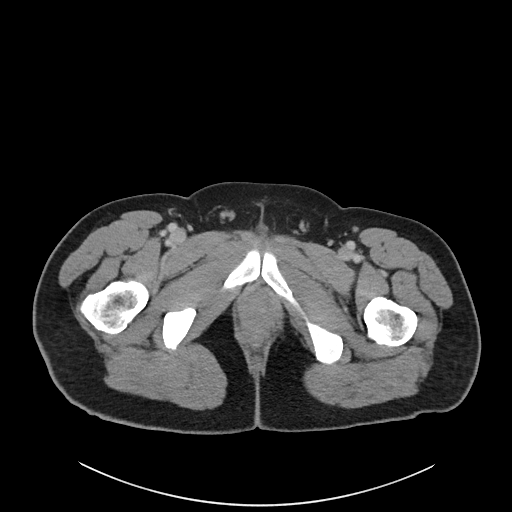
[im 23/101  soft-tissue]
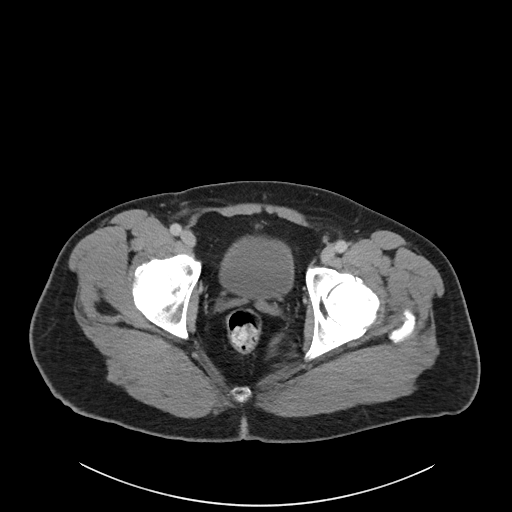
[im 28/101  soft-tissue]
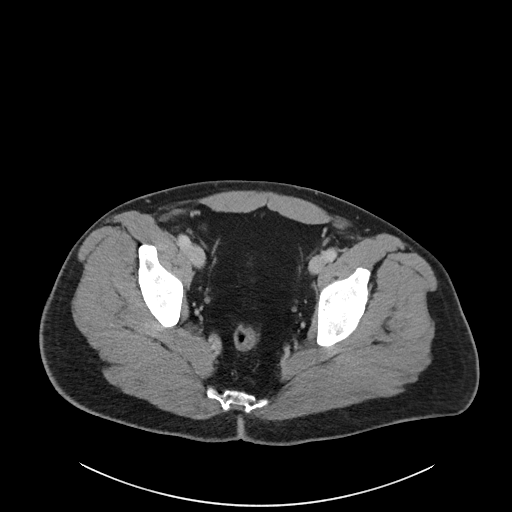
[im 34/101  soft-tissue]
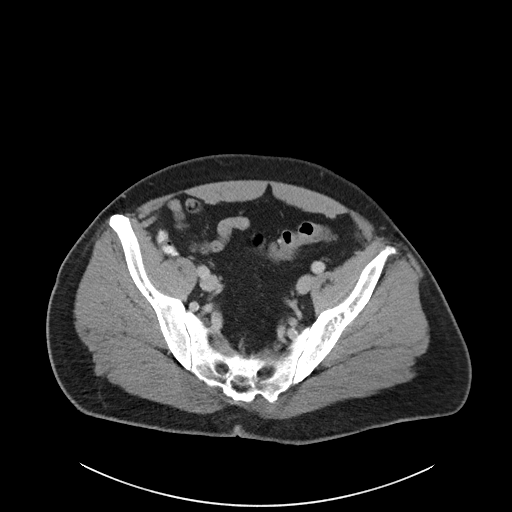
[im 39/101  soft-tissue]
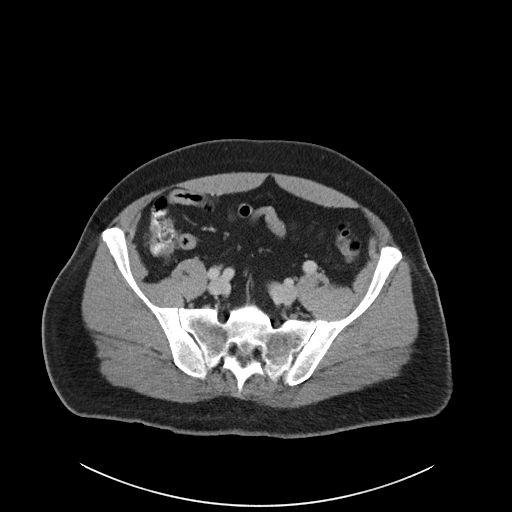
[im 45/101  soft-tissue]
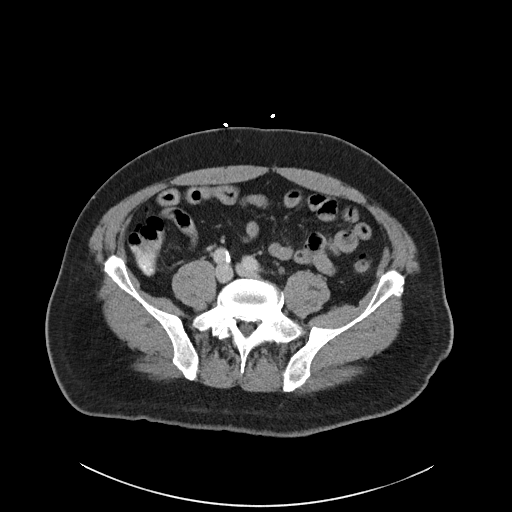
[im 56/101  soft-tissue]
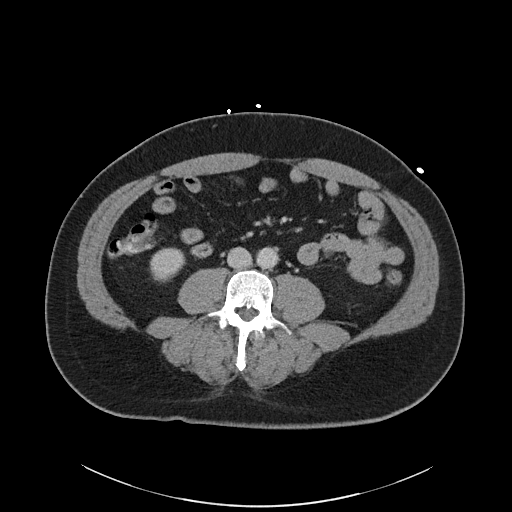
[im 62/101  soft-tissue]
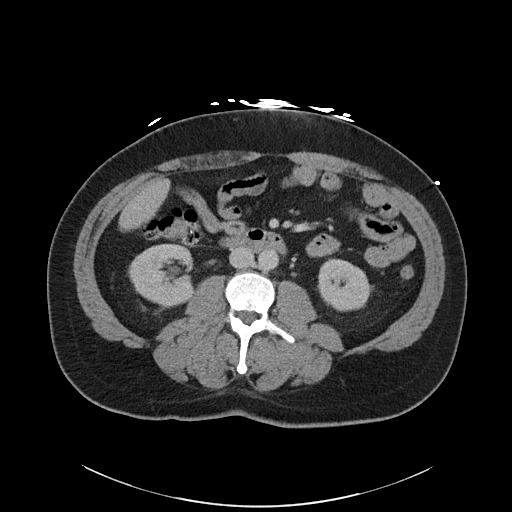
[im 62/101  bone]
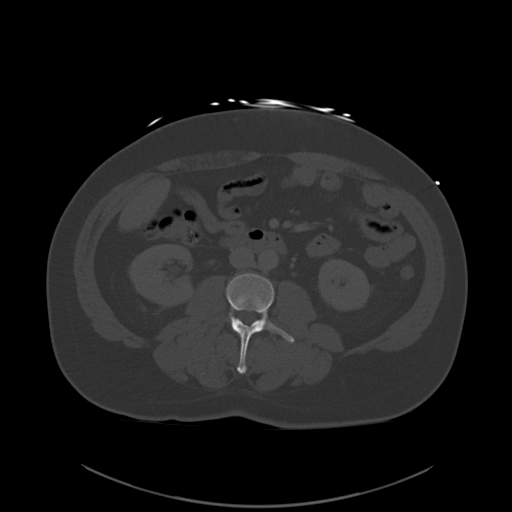
[im 67/101  soft-tissue]
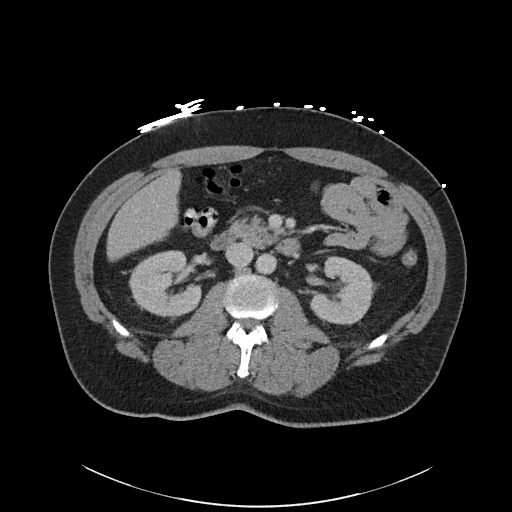
[im 73/101  soft-tissue]
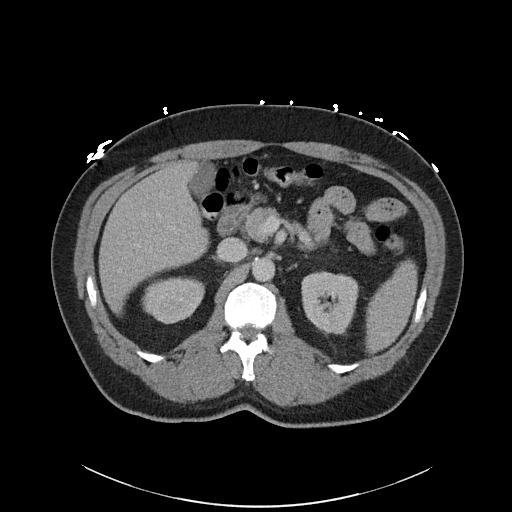
[im 78/101  soft-tissue]
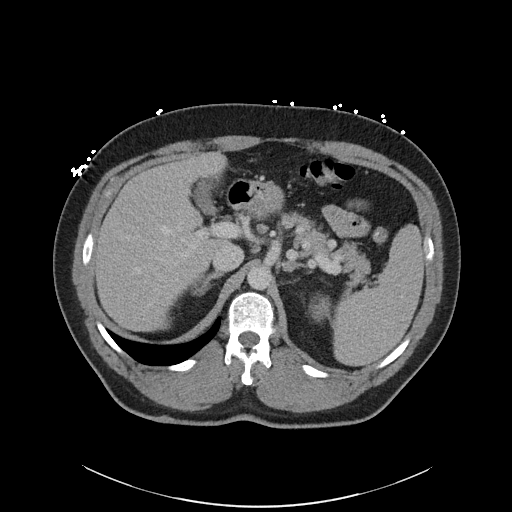
[im 89/101  soft-tissue]
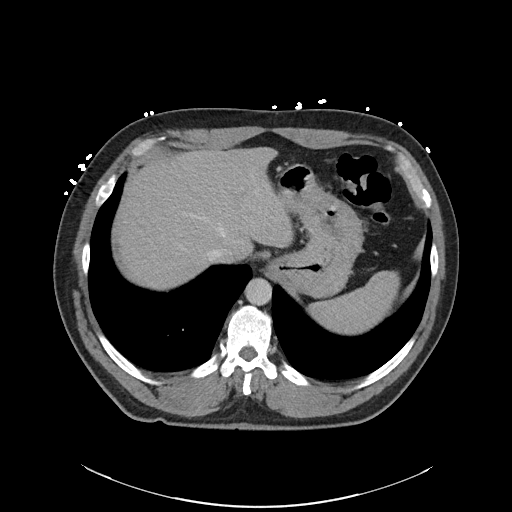
[im 95/101  soft-tissue]
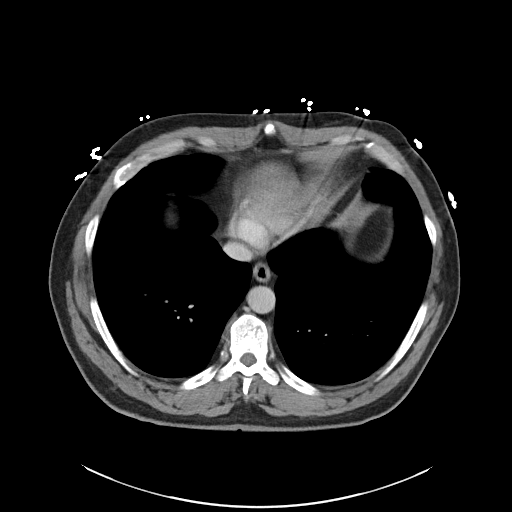

[Series 7: coronal soft tissue · coronal · 0.97mm/px · 3 of 109 slices shown]
[im 37/109  soft-tissue]
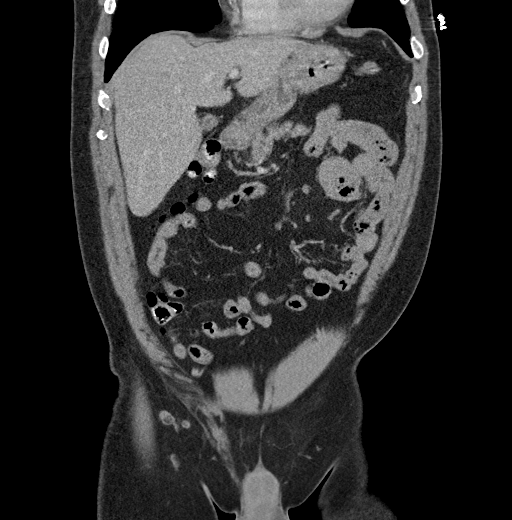
[im 49/109  soft-tissue]
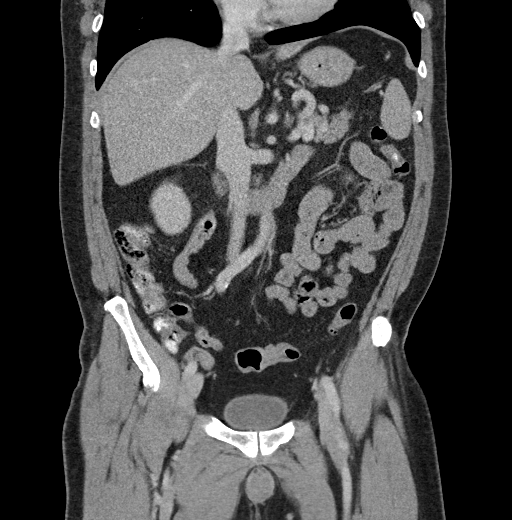
[im 61/109  soft-tissue]
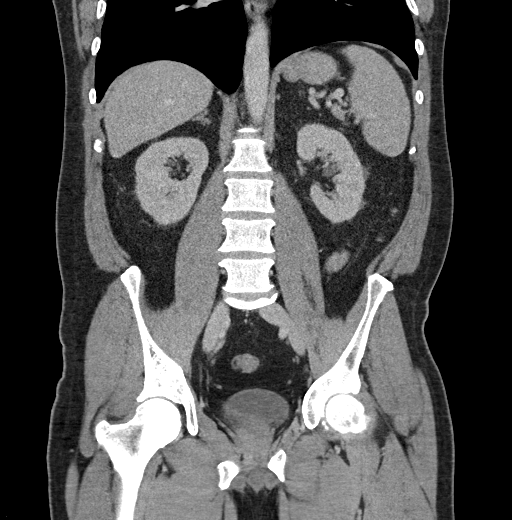

[17 of 46 positions shown; findings below may reference images not displayed]

FINDINGS: Lower chest: Lung bases clear.

Hepatobiliary: Gallbladder and liver normal appearance

Pancreas: Normal appearance

Spleen: Normal appearance

Adrenals/Urinary Tract: Adrenal glands, kidneys, ureters, and
bladder normal appearance

Stomach/Bowel: Appendix not visualized. Stomach under distended with
suboptimal assessment of wall thickness. Bowel loops normal
appearance

Vascular/Lymphatic: Few pelvic phleboliths. Minimal atherosclerotic
calcification aorta and iliac arteries. Aorta normal caliber. No
adenopathy.

Reproductive: Prostate gland and seminal vesicles unremarkable.

Other: No free air or free fluid. Tiny RIGHT inguinal hernia
containing fat. No acute inflammatory process.

Musculoskeletal: Retrolisthesis L5-S1 with endplate spur formation
and bulging disc. Degenerative changes LEFT hip joint.
IMPRESSION: No acute intra-abdominal or intrapelvic abnormalities.

Tiny RIGHT inguinal hernia containing fat.

Aortic Atherosclerosis ([id number]-[id number].F).

## 2024-01-21 ENCOUNTER — Other Ambulatory Visit: Payer: Self-pay
